# Patient Record
Sex: Male | Born: 1965 | Race: Black or African American | Hispanic: No | Marital: Single | Smoking: Former smoker
Health system: Southern US, Community
[De-identification: ages and names within clinical notes are randomized; demographics above are authoritative.]

## PROBLEM LIST (undated history)

## (undated) DIAGNOSIS — M1990 Unspecified osteoarthritis, unspecified site: Secondary | ICD-10-CM

## (undated) HISTORY — PX: WRIST SURGERY: SHX841

---

## 2004-07-21 ENCOUNTER — Emergency Department (HOSPITAL_COMMUNITY): Admission: EM | Admit: 2004-07-21 | Discharge: 2004-07-22 | Payer: Self-pay | Admitting: Emergency Medicine

## 2005-03-16 ENCOUNTER — Emergency Department (HOSPITAL_COMMUNITY): Admission: EM | Admit: 2005-03-16 | Discharge: 2005-03-16 | Payer: Self-pay | Admitting: Family Medicine

## 2005-12-30 ENCOUNTER — Emergency Department (HOSPITAL_COMMUNITY): Admission: EM | Admit: 2005-12-30 | Discharge: 2005-12-31 | Payer: Self-pay | Admitting: Emergency Medicine

## 2008-11-05 ENCOUNTER — Emergency Department (HOSPITAL_COMMUNITY): Admission: EM | Admit: 2008-11-05 | Discharge: 2008-11-06 | Payer: Self-pay | Admitting: Emergency Medicine

## 2008-12-19 ENCOUNTER — Emergency Department (HOSPITAL_COMMUNITY): Admission: EM | Admit: 2008-12-19 | Discharge: 2008-12-19 | Payer: Self-pay | Admitting: Emergency Medicine

## 2008-12-25 ENCOUNTER — Emergency Department (HOSPITAL_COMMUNITY): Admission: EM | Admit: 2008-12-25 | Discharge: 2008-12-25 | Payer: Self-pay | Admitting: Emergency Medicine

## 2009-03-14 ENCOUNTER — Ambulatory Visit (HOSPITAL_COMMUNITY): Admission: RE | Admit: 2009-03-14 | Discharge: 2009-03-14 | Payer: Self-pay | Admitting: Chiropractic Medicine

## 2009-10-17 ENCOUNTER — Encounter: Admission: RE | Admit: 2009-10-17 | Discharge: 2009-12-17 | Payer: Self-pay | Admitting: Internal Medicine
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
## 2010-08-21 ENCOUNTER — Ambulatory Visit (HOSPITAL_BASED_OUTPATIENT_CLINIC_OR_DEPARTMENT_OTHER): Admission: RE | Admit: 2010-08-21 | Discharge: 2010-08-21 | Payer: Self-pay | Admitting: Orthopedic Surgery

## 2010-11-01 ENCOUNTER — Encounter: Payer: Self-pay | Admitting: *Deleted

## 2010-12-23 LAB — POCT HEMOGLOBIN-HEMACUE: Hemoglobin: 14.1 g/dL (ref 13.0–17.0)

## 2011-01-22 LAB — POCT I-STAT, CHEM 8
BUN: 19 mg/dL (ref 6–23)
Calcium, Ion: 1.07 mmol/L — ABNORMAL LOW (ref 1.12–1.32)
Chloride: 107 mEq/L (ref 96–112)
Creatinine, Ser: 1.4 mg/dL (ref 0.4–1.5)
Glucose, Bld: 126 mg/dL — ABNORMAL HIGH (ref 70–99)
HCT: 43 % (ref 39.0–52.0)
Hemoglobin: 14.6 g/dL (ref 13.0–17.0)
Potassium: 4.8 mEq/L (ref 3.5–5.1)
Sodium: 139 mEq/L (ref 135–145)
TCO2: 26 mmol/L (ref 0–100)

## 2011-01-26 LAB — CBC
HCT: 43.6 % (ref 39.0–52.0)
Hemoglobin: 14.6 g/dL (ref 13.0–17.0)
MCHC: 33.5 g/dL (ref 30.0–36.0)
MCV: 93.5 fL (ref 78.0–100.0)
Platelets: 219 10*3/uL (ref 150–400)
RBC: 4.66 MIL/uL (ref 4.22–5.81)
RDW: 13.4 % (ref 11.5–15.5)
WBC: 13.2 10*3/uL — ABNORMAL HIGH (ref 4.0–10.5)

## 2011-01-26 LAB — COMPREHENSIVE METABOLIC PANEL
ALT: 19 U/L (ref 0–53)
AST: 21 U/L (ref 0–37)
Albumin: 3.5 g/dL (ref 3.5–5.2)
Alkaline Phosphatase: 102 U/L (ref 39–117)
BUN: 15 mg/dL (ref 6–23)
CO2: 27 mEq/L (ref 19–32)
Calcium: 9 mg/dL (ref 8.4–10.5)
Chloride: 106 mEq/L (ref 96–112)
Creatinine, Ser: 1.23 mg/dL (ref 0.4–1.5)
GFR calc Af Amer: >60 mL/min (ref >60)
GFR calc non Af Amer: >60 mL/min (ref >60)
Glucose, Bld: 103 mg/dL — ABNORMAL HIGH (ref 70–99)
Potassium: 4.4 mEq/L (ref 3.5–5.1)
Sodium: 139 mEq/L (ref 135–145)
Total Bilirubin: 1.3 mg/dL — ABNORMAL HIGH (ref 0.3–1.2)
Total Protein: 6.8 g/dL (ref 6.0–8.3)

## 2011-01-26 LAB — DIFFERENTIAL
Basophils Absolute: 0 10*3/uL (ref 0.0–0.1)
Basophils Relative: 0 % (ref 0–1)
Eosinophils Absolute: 0 10*3/uL (ref 0.0–0.7)
Eosinophils Relative: 0 % (ref 0–5)
Lymphocytes Relative: 20 % (ref 12–46)
Lymphs Abs: 2.6 10*3/uL (ref 0.7–4.0)
Monocytes Absolute: 0.5 10*3/uL (ref 0.1–1.0)
Monocytes Relative: 4 % (ref 3–12)
Neutro Abs: 10 10*3/uL — ABNORMAL HIGH (ref 1.7–7.7)
Neutrophils Relative %: 76 % (ref 43–77)

## 2011-02-24 NOTE — Op Note (Signed)
Brian Cain, Brian Cain              ACCOUNT NO.:  [id number]   MEDICAL RECORD NO.:  [id number]          PATIENT TYPE:  EMS   LOCATION:  [facility]                         FACILITY:  [facility]   PHYSICIAN:  Wilmon Arms. Corliss Skains, M.D. DATE OF BIRTH:  [birth year]   DATE OF PROCEDURE:  11/05/2008  DATE OF DISCHARGE:                               OPERATIVE REPORT   PREOPERATIVE DIAGNOSIS:  Left perirectal abscess.   POSTOPERATIVE DIAGNOSIS:  Left perirectal abscess.   PROCEDURE:  Incision and drainage, left perirectal abscess.   SURGEON:  Wilmon Arms. Tsuei, M.D.   ANESTHESIA:  Local.   INDICATIONS:  The patient is a 45 year old male with a history of  multiple previous perirectal abscesses who presents with a 5-day history  of increasing pain and swelling in the left perirectal region.  He  presented to the emergency department today where he was evaluated by  the emergency department physician.  His white count was elevated at  13.2.  On examination, he was noted to have a little bit of fluctuance  on the left side.  CT scan confirmed a 2-cm perirectal abscess.  We were  then asked to see the patient for I and D.   DESCRIPTION OF PROCEDURE:  The patient was in the left lateral position  on the emergency department bed.  The area over the fluctuance was  prepped with Betadine and was anesthetized with 1% lidocaine.  I made a  transverse incision measuring about 1.5 cm directly over this area.  We  encountered minimal purulent fluid coming out of this area.  I opened  one widely with a hemostat and explored the entire cavity.  The wound  was then packed with 1/4-inch new gauze.  A dry dressing was applied.  The patient is being discharged on Augmentin as well as p.r.n. pain  medication.  He may follow up with [facility] at [phone].      Wilmon Arms. Tsuei, M.D.  Electronically Signed     MKT/MEDQ  D:  11/05/2008  T:  11/06/2008  Job:  [id number]

## 2011-06-09 ENCOUNTER — Emergency Department (HOSPITAL_COMMUNITY)
Admission: EM | Admit: 2011-06-09 | Discharge: 2011-06-10 | Disposition: A | Discharge status: Home / Self Care | Payer: Medicaid Other | Attending: Emergency Medicine | Admitting: Emergency Medicine

## 2011-06-09 ENCOUNTER — Emergency Department (HOSPITAL_COMMUNITY): Payer: Medicaid Other

## 2011-06-09 DIAGNOSIS — R071 Chest pain on breathing: Secondary | ICD-10-CM | POA: Insufficient documentation

## 2011-06-09 LAB — COMPREHENSIVE METABOLIC PANEL
ALT: 22 U/L (ref 0–53)
AST: 16 U/L (ref 0–37)
Albumin: 4 g/dL (ref 3.5–5.2)
Alkaline Phosphatase: 111 U/L (ref 39–117)
BUN: 14 mg/dL (ref 6–23)
CO2: 29 mEq/L (ref 19–32)
Calcium: 10 mg/dL (ref 8.4–10.5)
Chloride: 101 mEq/L (ref 96–112)
Creatinine, Ser: 1.33 mg/dL (ref 0.50–1.35)
GFR calc Af Amer: >60 mL/min (ref >60)
GFR calc non Af Amer: 59 mL/min — ABNORMAL LOW (ref >60)
Glucose, Bld: 145 mg/dL — ABNORMAL HIGH (ref 70–99)
Potassium: 3.9 mEq/L (ref 3.5–5.1)
Sodium: 138 mEq/L (ref 135–145)
Total Bilirubin: 0.3 mg/dL (ref 0.3–1.2)
Total Protein: 7.3 g/dL (ref 6.0–8.3)

## 2011-06-09 LAB — CBC
HCT: 39.4 % (ref 39.0–52.0)
Hemoglobin: 14 g/dL (ref 13.0–17.0)
MCH: 32.3 pg (ref 26.0–34.0)
MCHC: 35.5 g/dL (ref 30.0–36.0)
MCV: 90.8 fL (ref 78.0–100.0)
Platelets: 224 10*3/uL (ref 150–400)
RBC: 4.34 MIL/uL (ref 4.22–5.81)
RDW: 12.9 % (ref 11.5–15.5)
WBC: 9.6 10*3/uL (ref 4.0–10.5)

## 2011-06-09 LAB — POCT I-STAT TROPONIN I: Troponin i, poc: 0.01 ng/mL (ref 0.00–0.08)

## 2011-06-09 LAB — CK TOTAL AND CKMB (NOT AT ARMC)
CK, MB: 3 ng/mL (ref 0.3–4.0)
Relative Index: 1.8 (ref 0.0–2.5)
Total CK: 163 U/L (ref 7–232)

## 2011-06-09 LAB — LIPASE, BLOOD: Lipase: 92 U/L — ABNORMAL HIGH (ref 11–59)

## 2011-06-09 LAB — TROPONIN I: Troponin I: <0.3 ng/mL (ref <0.30)

## 2011-06-10 LAB — POCT I-STAT TROPONIN I: Troponin i, poc: 0 ng/mL (ref 0.00–0.08)

## 2011-08-21 ENCOUNTER — Encounter: Payer: Self-pay | Admitting: *Deleted

## 2011-08-21 ENCOUNTER — Ambulatory Visit (HOSPITAL_COMMUNITY)
Admission: EM | Admit: 2011-08-21 | Discharge: 2011-08-22 | DRG: 506 | Disposition: A | Discharge status: Home / Self Care | Payer: Medicaid Other | Attending: Orthopedic Surgery | Admitting: Orthopedic Surgery

## 2011-08-21 ENCOUNTER — Emergency Department (HOSPITAL_COMMUNITY): Payer: Medicaid Other

## 2011-08-21 DIAGNOSIS — S61459A Open bite of unspecified hand, initial encounter: Secondary | ICD-10-CM

## 2011-08-21 DIAGNOSIS — F17200 Nicotine dependence, unspecified, uncomplicated: Secondary | ICD-10-CM | POA: Diagnosis present

## 2011-08-21 DIAGNOSIS — S61409A Unspecified open wound of unspecified hand, initial encounter: Secondary | ICD-10-CM | POA: Diagnosis present

## 2011-08-21 DIAGNOSIS — M009 Pyogenic arthritis, unspecified: Secondary | ICD-10-CM | POA: Diagnosis present

## 2011-08-21 DIAGNOSIS — L03113 Cellulitis of right upper limb: Secondary | ICD-10-CM

## 2011-08-21 DIAGNOSIS — W540XXA Bitten by dog, initial encounter: Secondary | ICD-10-CM | POA: Diagnosis present

## 2011-08-21 LAB — CBC
MCH: 32.5 pg (ref 26.0–34.0)
MCHC: 35.1 g/dL (ref 30.0–36.0)
MCV: 92.5 fL (ref 78.0–100.0)
Platelets: 233 10*3/uL (ref 150–400)
RDW: 13.3 % (ref 11.5–15.5)

## 2011-08-21 MED ORDER — RABIES IMMUNE GLOBULIN 150 UNIT/ML IM INJ
20.0000 [IU]/kg | INJECTION | Freq: Once | INTRAMUSCULAR | Status: AC
  Administered 2011-08-21: 2250 [IU] via INTRAMUSCULAR
  Filled 2011-08-21: qty 15

## 2011-08-21 MED ORDER — PIPERACILLIN-TAZOBACTAM 3.375 G IVPB: 3.3750 g | Freq: Once | INTRAVENOUS | Status: DC

## 2011-08-21 MED ORDER — RABIES VACCINE, PCEC IM SUSR
1.0000 mL | Freq: Once | INTRAMUSCULAR | Status: AC
  Administered 2011-08-21: 1 mL via INTRAMUSCULAR
  Filled 2011-08-21 (×2): qty 1

## 2011-08-21 MED ORDER — LACTATED RINGERS IV SOLN
INTRAVENOUS | Status: DC
  Administered 2011-08-21: via INTRAVENOUS

## 2011-08-21 MED ORDER — SODIUM CHLORIDE 0.9 % IV SOLN
1.5000 g | Freq: Once | INTRAVENOUS | Status: AC
  Administered 2011-08-22: 1.5 g via INTRAVENOUS
  Filled 2011-08-21: qty 1.5

## 2011-08-21 MED ORDER — MORPHINE SULFATE 4 MG/ML IJ SOLN
1.0000 mg | INTRAMUSCULAR | Status: DC | PRN
  Administered 2011-08-21 – 2011-08-22 (×2): 1 mg via INTRAVENOUS
  Filled 2011-08-21: qty 1

## 2011-08-21 MED ORDER — ONDANSETRON HCL 4 MG/2ML IJ SOLN
4.0000 mg | Freq: Four times a day (QID) | INTRAMUSCULAR | Status: DC | PRN
  Administered 2011-08-21: 4 mg via INTRAVENOUS
  Filled 2011-08-21: qty 2

## 2011-08-21 MED ORDER — SODIUM CHLORIDE 0.9 % IV SOLN: Freq: Once | INTRAVENOUS | Status: DC

## 2011-08-21 NOTE — ED Notes (Signed)
Pt reports he was bite by a dog yesterday. Unknown if dog has shots. Noted swelling to right hand. With warmth. Unable to feel pulse because of swelling. Cap refill less than 2. Reports he feels like he has had a fever.

## 2011-08-21 NOTE — ED Notes (Signed)
Pt was walking his dog yesterday when he was bitten in hand by another dog.  Pt does not know where dog came from or where he is at this time.  Last tetanus was 3 years prior.  R hand red with increasing pain.

## 2011-08-21 NOTE — Progress Notes (Signed)
MEDICATION RELATED CONSULT NOTE - INITIAL   Pharmacy Consult for Rabies immune globulin  Indication: Dog Bite  No Known Allergies  Patient Measurements: Height: 6' (182.9 cm) Weight: 245 lb (111.131 kg) IBW/kg (Calculated) : 77.6   Vital Signs: Temp: 98.4 F (36.9 C) (11/09 2146) Temp src: Oral (11/09 2146) BP: 163/91 mmHg (11/09 2146) Pulse Rate: 73  (11/09 2146) Intake/Output from previous day:   Intake/Output from this shift:    Labs:  [facility] 08/21/11 1912  WBC 12.1*  HGB 14.8  HCT 42.2  PLT 233  APTT --  CREATININE --  LABCREA --  CREATININE --  CREAT24HRUR --  MG --  PHOS --  ALBUMIN --  PROT --  ALBUMIN --  AST --  ALT --  ALKPHOS --  BILITOT --  BILIDIR --  IBILI --   Estimated Creatinine Clearance: 92.2 ml/min (by C-G formula based on Cr of 1.33).   Microbiology: No results found for this or any previous visit (from the past 720 hour(s)).  Medical History: History reviewed. No pertinent past medical history.  Medications:   (Not in a hospital admission) Scheduled:    . rabies immune globulin  20 Units/kg Intramuscular Once  . rabies vaccine, PCEC  1 mL Intramuscular Once    Assessment: 45 y.o. M to receive rabies immune globulin after being bit by a dog yesterday. The patient weighs ~111 kg. The patient is also receiving a rabies vaccine tonight.  Plan:  1. Rabies immune globulin 2250 units x 1 dose 2. Would recommend that the patient also receive a total of 4 doses of rabies vaccine (day 0, 3, 7, and 14) for postexposure prophylaxis. 3. Educated nursing staff on proper administration of both rabies immune globulin and vaccine  Rolley Sims 08/21/2011,10:29 PM

## 2011-08-21 NOTE — H&P (Signed)
Brian Cain is an 45 y.o. male.   Chief Complaint: Dog bite HPI: 62 yo RHD male states he was bitten by unknown dog yesterday.  Bite to right hand.  Began to have swelling and pain in hand today.  No F/C/NS.  No previous injury to right hand.  No other bites at this time.  History reviewed. No pertinent past medical history.  No past surgical history on file.  Family History  Problem Relation Age of Onset  . Cancer Mother    Social History:  reports that he has been smoking Cigarettes.  He has smoked for the past 1 year. He has never used smokeless tobacco. He reports that he drinks alcohol. He reports that he does not use illicit drugs.  Allergies: No Known Allergies  Medications Prior to Admission  Medication Dose Route Frequency Provider Last Rate Last Dose  . 0.9 %  sodium chloride infusion   Intravenous Once Tatyana A Kirichenko, PA      . piperacillin-tazobactam (ZOSYN) IVPB 3.375 g  3.375 g Intravenous Once Tatyana A Kirichenko, PA      . rabies immune globulin (HYPERAB) injection 2,250 Units  20 Units/kg Intramuscular Once Tatyana A Kirichenko, PA      . rabies vaccine, PCEC (RABAVERT) injection 1 mL  1 mL Intramuscular Once Tatyana A Kirichenko, PA       No current outpatient prescriptions on file as of 08/21/2011.    Results for orders placed during the hospital encounter of 08/21/11 (from the past 48 hour(s))  CBC     Status: Abnormal   Collection Time   08/21/11  7:12 PM      Component Value Range Comment   WBC 12.1 (*) 4.0 - 10.5 (K/uL)    RBC 4.56  4.22 - 5.81 (MIL/uL)    Hemoglobin 14.8  13.0 - 17.0 (g/dL)    HCT 40.9  81.1 - 91.4 (%)    MCV 92.5  78.0 - 100.0 (fL)    MCH 32.5  26.0 - 34.0 (pg)    MCHC 35.1  30.0 - 36.0 (g/dL)    RDW 78.2  95.6 - 21.3 (%)    Platelets 233  150 - 400 (K/uL)     Dg Hand Complete Right  08/21/2011  *RADIOLOGY REPORT*  Clinical Data: Bitten by polyp, redness/swelling to right hand, body marks at the level of second/third MCP  joints  RIGHT HAND - COMPLETE 3+ VIEW  Comparison: None.  Findings: No fracture or dislocation is seen.  The joint spaces are preserved.  Moderate soft tissue swelling involving the dorsum of the hand.  No radiopaque foreign body is seen.  IMPRESSION: No fracture, dislocation, or radiopaque foreign body is seen.  Moderate soft tissue swelling involving the dorsum of the hand.  Original Report Authenticated By: Charline Bills, M.D.     A comprehensive review of systems was negative.  Blood pressure 163/91, pulse 73, temperature 98.4 F (36.9 C), temperature source Oral, resp. rate 18, height 6' (1.829 m), weight 111.131 kg (245 lb), SpO2 97.00%.  General appearance: alert, cooperative and appears stated age Head: Normocephalic, without obvious abnormality, atraumatic Neck: supple, symmetrical, trachea midline Resp: clear to auscultation bilaterally Cardio: regular rate and rhythm, S1, S2 normal, no murmur, click, rub or gallop GI: soft, non-tender; bowel sounds normal; no masses,  no organomegaly Extremities: Bilateral upper extremitis intact to light touch sensation and capillary refill in all digits.  +epl/fpl/io.  left upper extremity without wound or tenderness to palpation.  Right upper extremity: bite wounds dorsum of hand at long finger MP joint.  Swelling, warmth, erythema dorsal hand.  Tender to palpation dorsally.  No volar tenderness except minimally at long MP joint.  can flex and extend all digits.  Bite wound on volar ring finger and dorsal small finger without erythema or swelling surrounding. Pulses: 2+ and symmetric Skin: Bite wounds as described above. Neurologic: Grossly normal Incision/Wound: Wounds as above.  Assessment/Plan Recommend OR for irrigation and debridement of dog bite wounds right hand.  May need I&D of long finger MP joint.  Risks, benefits, alternatives discussed.  Patient agrees with plan.    Valeria Krisko R 08/21/2011, 11:03 PM

## 2011-08-21 NOTE — ED Provider Notes (Signed)
History     CSN: [id number] Arrival date & time: 08/21/2011  7:03 PM   First MD Initiated Contact with Patient 08/21/11 2128      Chief Complaint  Patient presents with  . Animal Bite    (Consider location/radiation/quality/duration/timing/severity/associated sxs/prior treatment) Patient is a 45 y.o. male presenting with animal bite. The history is provided by the patient.  Animal Bite  The incident occurred yesterday. There is an injury to the right hand. There is an injury to the right ring finger and right little finger. The pain is moderate. It is unknown if a foreign body is present. Associated symptoms include pain when bearing weight. Pertinent negatives include no numbness, no abdominal pain, no nausea, no vomiting and no weakness. There have been no prior injuries to these areas. He is right-handed. His tetanus status is UTD.  Pt bit on the right hand by a rottweiler  Yesterday. Unsure of dog's rabies status or who the dog belongs to. States was feeling OK until this afternoon around 3 pm when hand began to swell and turned red and warm. Denies fever, chills, malaise.   History reviewed. No pertinent past medical history.  No past surgical history on file.  Family History  Problem Relation Age of Onset  . Cancer Mother     History  Substance Use Topics  . Smoking status: Current Everyday Smoker -- 1 years    Types: Cigarettes  . Smokeless tobacco: Never Used  . Alcohol Use: Yes      Review of Systems  Constitutional: Negative for fever, chills and fatigue.  HENT: Negative.   Respiratory: Negative.   Cardiovascular: Negative.   Gastrointestinal: Negative.  Negative for nausea, vomiting and abdominal pain.  Musculoskeletal: Positive for joint swelling.  Neurological: Negative for dizziness, weakness and numbness.  Hematological: Negative.     Allergies  Review of patient's allergies indicates no known allergies.  Home Medications   Current Outpatient Rx    Name Route Sig Dispense Refill  . PRESCRIPTION MEDICATION Oral Take 1 tablet by mouth 2 (two) times daily as needed. For relief from boil.  Prescription antibiotic given for boils.       BP 163/91  Pulse 73  Temp(Src) 98.4 F (36.9 C) (Oral)  Resp 18  SpO2 97%  Physical Exam  Constitutional: He is oriented to person, place, and time. He appears well-developed and well-nourished.  HENT:  Head: Normocephalic and atraumatic.  Neck: Neck supple.  Cardiovascular: Normal rate, regular rhythm and normal heart sounds.   Pulmonary/Chest: Effort normal and breath sounds normal.  Musculoskeletal:       Right hand swelling from wrist down to all fingers, erythema to the dorsum of the right hand. Two puncutre marks to the dorsal right hand and one to the ring finger, one to 5th finger. Pain with ROM of all finger, pt unable to flex or extend any fingers.   Neurological: He is alert and oriented to person, place, and time. He has normal reflexes.  Skin: Skin is warm and dry. There is erythema.  Psychiatric: He has a normal mood and affect.    ED Course  Procedures (including critical care time)  Labs Reviewed  CBC - Abnormal; Notable for the following:    WBC 12.1 (*)    All other components within normal limits   Dg Hand Complete Right  08/21/2011  *RADIOLOGY REPORT*  Clinical Data: Bitten by polyp, redness/swelling to right hand, body marks at the level of second/third MCP joints  RIGHT HAND - COMPLETE 3+ VIEW  Comparison: None.  Findings: No fracture or dislocation is seen.  The joint spaces are preserved.  Moderate soft tissue swelling involving the dorsum of the hand.  No radiopaque foreign body is seen.  IMPRESSION: No fracture, dislocation, or radiopaque foreign body is seen.  Moderate soft tissue swelling involving the dorsum of the hand.  Original Report Authenticated By: Charline Bills, M.D.     10:03 PM Negative right hand x-ray. Pt has significant swelling and pain with  movement of the right hand. Will get hand consulted. Rabies ordered since unknown dog or rabies status.    10:17 PM Pt seen by Dr. Merlyn Lot, will go up to OR  MDM          Lottie Mussel, PA 08/21/11 2217

## 2011-08-22 ENCOUNTER — Inpatient Hospital Stay (HOSPITAL_COMMUNITY): Payer: Medicaid Other | Admitting: Anesthesiology

## 2011-08-22 ENCOUNTER — Encounter (HOSPITAL_COMMUNITY)
Admission: EM | Disposition: A | Discharge status: Home / Self Care | Payer: Self-pay | Source: Home / Self Care | Attending: Emergency Medicine

## 2011-08-22 ENCOUNTER — Encounter (HOSPITAL_COMMUNITY): Payer: Self-pay | Admitting: Anesthesiology

## 2011-08-22 HISTORY — PX: I&D EXTREMITY: SHX5045

## 2011-08-22 SURGERY — IRRIGATION AND DEBRIDEMENT EXTREMITY
Anesthesia: General | Laterality: Right

## 2011-08-22 MED ORDER — PROMETHAZINE HCL 25 MG/ML IJ SOLN: 6.2500 mg | INTRAMUSCULAR | Status: DC | PRN

## 2011-08-22 MED ORDER — SUCCINYLCHOLINE CHLORIDE 20 MG/ML IJ SOLN
INTRAMUSCULAR | Status: DC | PRN
  Administered 2011-08-22: 100 mg via INTRAVENOUS

## 2011-08-22 MED ORDER — HYDROMORPHONE HCL PF 1 MG/ML IJ SOLN
0.2500 mg | INTRAMUSCULAR | Status: DC | PRN
  Administered 2011-08-22 (×2): 0.05 mg via INTRAVENOUS

## 2011-08-22 MED ORDER — KETOROLAC TROMETHAMINE 30 MG/ML IJ SOLN
INTRAMUSCULAR | Status: DC | PRN
  Administered 2011-08-22: 30 mg via INTRAVENOUS

## 2011-08-22 MED ORDER — MEPERIDINE HCL 25 MG/ML IJ SOLN: 6.2500 mg | INTRAMUSCULAR | Status: DC | PRN

## 2011-08-22 MED ORDER — SODIUM CHLORIDE 0.9 % IV SOLN
1.5000 g | Freq: Four times a day (QID) | INTRAVENOUS | Status: DC
  Filled 2011-08-22: qty 1.5

## 2011-08-22 MED ORDER — LIDOCAINE HCL (CARDIAC) 20 MG/ML IV SOLN
INTRAVENOUS | Status: DC | PRN
  Administered 2011-08-22: 100 mg via INTRAVENOUS

## 2011-08-22 MED ORDER — SODIUM CHLORIDE 0.9 % IV SOLN: 1.5000 g | INTRAVENOUS | Status: DC

## 2011-08-22 MED ORDER — BUPIVACAINE HCL (PF) 0.25 % IJ SOLN
INTRAMUSCULAR | Status: DC | PRN
  Administered 2011-08-22: 4.5 mL

## 2011-08-22 MED ORDER — MIDAZOLAM HCL 2 MG/2ML IJ SOLN: 0.5000 mg | Freq: Once | INTRAMUSCULAR | Status: DC | PRN

## 2011-08-22 MED ORDER — PROPOFOL 10 MG/ML IV EMUL
INTRAVENOUS | Status: DC | PRN
  Administered 2011-08-22: 200 mg via INTRAVENOUS

## 2011-08-22 MED ORDER — SODIUM CHLORIDE 0.9 % IR SOLN
Status: DC | PRN
  Administered 2011-08-22: 500 mL

## 2011-08-22 MED ORDER — FENTANYL CITRATE 0.05 MG/ML IJ SOLN
INTRAMUSCULAR | Status: DC | PRN
  Administered 2011-08-22: 150 ug via INTRAVENOUS
  Administered 2011-08-22: 100 ug via INTRAVENOUS

## 2011-08-22 MED ORDER — SODIUM CHLORIDE 0.9 % IV SOLN
1.5000 g | INTRAVENOUS | Status: DC | PRN
  Administered 2011-08-22: 1.5 g via INTRAVENOUS

## 2011-08-22 MED ORDER — LACTATED RINGERS IV SOLN
INTRAVENOUS | Status: DC | PRN
  Administered 2011-08-22 (×2): via INTRAVENOUS

## 2011-08-22 SURGICAL SUPPLY — 63 items
BANDAGE ACE 4 STERILE (GAUZE/BANDAGES/DRESSINGS) ×2 IMPLANT
BANDAGE COBAN STERILE 2 (GAUZE/BANDAGES/DRESSINGS) IMPLANT
BANDAGE CONFORM 2  STR LF (GAUZE/BANDAGES/DRESSINGS) IMPLANT
BANDAGE ELASTIC 3 VELCRO ST LF (GAUZE/BANDAGES/DRESSINGS) IMPLANT
BANDAGE ELASTIC 4 VELCRO ST LF (GAUZE/BANDAGES/DRESSINGS) ×2 IMPLANT
BANDAGE GAUZE ELAST BULKY 4 IN (GAUZE/BANDAGES/DRESSINGS) ×2 IMPLANT
BNDG COHESIVE 1X5 TAN STRL LF (GAUZE/BANDAGES/DRESSINGS) IMPLANT
BNDG ESMARK 4X9 LF (GAUZE/BANDAGES/DRESSINGS) ×2 IMPLANT
CLOTH BEACON ORANGE TIMEOUT ST (SAFETY) ×2 IMPLANT
CORDS BIPOLAR (ELECTRODE) ×2 IMPLANT
COVER SURGICAL LIGHT HANDLE (MISCELLANEOUS) ×2 IMPLANT
CUFF TOURNIQUET SINGLE 18IN (TOURNIQUET CUFF) ×2 IMPLANT
DECANTER SPIKE VIAL GLASS SM (MISCELLANEOUS) ×2 IMPLANT
DRAIN PENROSE 1/4X12 LTX STRL (WOUND CARE) IMPLANT
DRAPE SURG 17X23 STRL (DRAPES) ×2 IMPLANT
DRSG ADAPTIC 3X8 NADH LF (GAUZE/BANDAGES/DRESSINGS) IMPLANT
DRSG EMULSION OIL 3X3 NADH (GAUZE/BANDAGES/DRESSINGS) IMPLANT
DRSG PAD ABDOMINAL 8X10 ST (GAUZE/BANDAGES/DRESSINGS) IMPLANT
GAUZE PACKING IODOFORM 1/4X5 (PACKING) ×2 IMPLANT
GAUZE XEROFORM 1X8 LF (GAUZE/BANDAGES/DRESSINGS) ×2 IMPLANT
GLOVE BIO SURGEON STRL SZ 6.5 (GLOVE) ×2 IMPLANT
GLOVE BIO SURGEON STRL SZ7 (GLOVE) ×2 IMPLANT
GLOVE BIO SURGEON STRL SZ7.5 (GLOVE) ×2 IMPLANT
GLOVE BIOGEL PI IND STRL 6.5 (GLOVE) ×1 IMPLANT
GLOVE BIOGEL PI IND STRL 8 (GLOVE) ×1 IMPLANT
GLOVE BIOGEL PI INDICATOR 6.5 (GLOVE) ×1
GLOVE BIOGEL PI INDICATOR 8 (GLOVE) ×1
GOWN BRE IMP SLV AUR LG STRL (GOWN DISPOSABLE) ×2 IMPLANT
GOWN STRL REIN XL XLG (GOWN DISPOSABLE) ×2 IMPLANT
HANDPIECE INTERPULSE COAX TIP (DISPOSABLE)
IV CYSTO TUBING LATEX FREE (IV SOLUTION) IMPLANT
KIT BASIN OR (CUSTOM PROCEDURE TRAY) ×2 IMPLANT
KIT ROOM TURNOVER OR (KITS) ×2 IMPLANT
LOOP VESSEL MAXI BLUE (MISCELLANEOUS) ×2 IMPLANT
LOOP VESSEL MINI RED (MISCELLANEOUS) IMPLANT
MANIFOLD NEPTUNE II (INSTRUMENTS) ×2 IMPLANT
NEEDLE 22X1 1/2 (OR ONLY) (NEEDLE) ×2 IMPLANT
NEEDLE HYPO 25X1 1.5 SAFETY (NEEDLE) ×2 IMPLANT
NS IRRIG 1000ML POUR BTL (IV SOLUTION) ×2 IMPLANT
PACK ORTHO EXTREMITY (CUSTOM PROCEDURE TRAY) ×2 IMPLANT
PAD ARMBOARD 7.5X6 YLW CONV (MISCELLANEOUS) ×2 IMPLANT
PADDING CAST ABS 4INX4YD NS (CAST SUPPLIES) ×1
PADDING CAST ABS COTTON 4X4 ST (CAST SUPPLIES) ×1 IMPLANT
SCRUB BETADINE 4OZ XXX (MISCELLANEOUS) ×2 IMPLANT
SET HNDPC FAN SPRY TIP SCT (DISPOSABLE) IMPLANT
SOLUTION BETADINE 4OZ (MISCELLANEOUS) ×2 IMPLANT
SPONGE GAUZE 4X4 12PLY (GAUZE/BANDAGES/DRESSINGS) ×2 IMPLANT
SPONGE LAP 18X18 X RAY DECT (DISPOSABLE) IMPLANT
SPONGE LAP 4X18 X RAY DECT (DISPOSABLE) ×2 IMPLANT
SUCTION FRAZIER TIP 10 FR DISP (SUCTIONS) ×2 IMPLANT
SUT ETHILON 4 0 PS 2 18 (SUTURE) IMPLANT
SUT MON AB 5-0 P3 18 (SUTURE) IMPLANT
SWAB CULTURE LIQ STUART DBL (MISCELLANEOUS) ×2 IMPLANT
SYR 20ML ECCENTRIC (SYRINGE) ×2 IMPLANT
SYR CONTROL 10ML LL (SYRINGE) ×2 IMPLANT
TOWEL OR 17X24 6PK STRL BLUE (TOWEL DISPOSABLE) ×2 IMPLANT
TOWEL OR 17X26 10 PK STRL BLUE (TOWEL DISPOSABLE) ×2 IMPLANT
TUBE ANAEROBIC SPECIMEN COL (MISCELLANEOUS) ×2 IMPLANT
TUBE CONNECTING 12X1/4 (SUCTIONS) ×2 IMPLANT
TUBE FEEDING 5FR 15 INCH (TUBING) IMPLANT
UNDERPAD 30X30 INCONTINENT (UNDERPADS AND DIAPERS) ×2 IMPLANT
WATER STERILE IRR 1000ML POUR (IV SOLUTION) IMPLANT
YANKAUER SUCT BULB TIP NO VENT (SUCTIONS) ×2 IMPLANT

## 2011-08-22 NOTE — ED Provider Notes (Signed)
Medical screening examination/treatment/procedure(s) were performed by non-physician practitioner and as supervising physician I was immediately available for consultation/collaboration.  Flint Melter, MD 08/22/11 [phone]

## 2011-08-22 NOTE — Progress Notes (Signed)
IV saline locked with 400cc left in bag

## 2011-08-22 NOTE — Anesthesia Preprocedure Evaluation (Addendum)
Anesthesia Evaluation  Patient identified by MRN, date of birth, ID band Patient awake    Reviewed: Allergy & Precautions, H&P , NPO status   Airway Mallampati: II TM Distance: >3 FB Neck ROM: Full    Dental  (+) Teeth Intact   Pulmonary          Cardiovascular     Neuro/Psych    GI/Hepatic   Endo/Other    Renal/GU      Musculoskeletal   Abdominal   Peds  Hematology   Anesthesia Other Findings   Reproductive/Obstetrics                          Anesthesia Physical Anesthesia Plan  ASA: II  Anesthesia Plan: General   Post-op Pain Management:    Induction: Intravenous and Cricoid pressure planned  Airway Management Planned: Oral ETT  Additional Equipment:   Intra-op Plan:   Post-operative Plan: Extubation in OR  Informed Consent: I have reviewed the patients History and Physical, chart, labs and discussed the procedure including the risks, benefits and alternatives for the proposed anesthesia with the patient or authorized representative who has indicated his/her understanding and acceptance.   Dental advisory given  Plan Discussed with:   Anesthesia Plan Comments:         Anesthesia Quick Evaluation

## 2011-08-22 NOTE — Op Note (Signed)
[name], [name] NO.:  [id number]  MEDICAL RECORD NO.:  [id number]  LOCATION:  [facility]                         FACILITY:  [facility]  PHYSICIAN:  Betha Loa, MD        DATE OF BIRTH:  08/14/1966  DATE OF PROCEDURE:  08/22/2011 DATE OF DISCHARGE:                              OPERATIVE REPORT   PREOPERATIVE DIAGNOSIS:  Right hand infected dog bite.  POSTOP DIAGNOSIS:  Right hand infected dog bite.  PROCEDURE:  Irrigation and debridement of dog bite wounds right hand and right long finger MP joint.  SURGEON:  Betha Loa, MD  ASSISTANTS:  None.  ANESTHESIA:  General.  IV FLUIDS:  Per anesthesia flow sheet.  ESTIMATED BLOOD LOSS:  Minimal.  COMPLICATIONS:  None.  SPECIMENS:  Cultures to Micro.  TOURNIQUET TIME:  33 minutes.  DISPOSITION:  Stable to PACU.  INDICATIONS:  Brian Cain is a 45 year old male who 2 days ago was bitten by an unknown dog.  He yesterday began to note some swelling, pain, and erythema in the hand.  He presented to [facility] Emergency Department where I was consulted for evaluation.  On evaluation, he had intact sensation and capillary refill in the fingertips.  He had swollen hand.  There was erythema on the dorsum of the hand.  There was a bite wound at the dorsal aspect of the MP joint of the long finger, one on the ulnar side of the ring finger and one on the dorsum of the small finger.  I discussed with Mr. Marcella the nature of dog bites.  I recommended going to the operating room for irrigation and debridement of the bite wounds and likely the MP joint of the long finger.  Risks, benefits, alternatives of surgery were discussed including risk of blood loss, infection, damage to nerves, vessels, tendons, ligaments, bone, failure of surgery, need for additional surgery, complications of wound healing, continued pain, nonunion, malunion, stiffness, need for repeat irrigation and debridement.  He voiced understanding of these  risks and elected to proceed.  OPERATIVE COURSE:  After being identified preoperatively by myself, the patient and I agreed upon procedure and site of procedure.  Surgical site was marked.  The risks, benefits, and alternative surgery were reviewed and he wished to proceed.  Surgical consent has been signed. His IV Unasyn was redosed.  He was transferred to the operating room, placed on the operating table in supine position with the right upper extremity on arm board.  General anesthesia was induced by the anesthesiologist.  Right upper extremity was prepped and draped in normal sterile orthopedic fashion.  Surgical pause was performed between surgeons, anesthesia, and operating staff, and all were in agreement with the patient, procedure, and site of procedure.  An Esmarch bandage was used on the forearm and after gravity exsanguination for the hand. Tourniquet was inflated to 250 mmHg.  The 2 wounds on the dorsum of the hand at the MP joint and long finger were connected.  There was edema within the tissues.  There was 1 piece of hard material that was sent with cultures.  Aerobes and anaerobes cultures were taken.  The MP joint was entered.  There was a marked damage to the articular cartilage on the dorsal aspect of the metacarpal head.  There was no gross purulence. 150 mL of sterile saline were rinsed through the MP joint using angiocatheter needle.  The wounds on the ring finger and small finger were opened.  The wound on the small finger was extended slightly distally.  There was no gross purulence, but there was some cloudy fluid in the small finger.  All wounds were copiously irrigated with 1000 mL of sterile saline.  All wounds were then packed with quarter-inch iodoform gauze.  Vessel loop drains were placed in the MP joint.  The wounds were then injected with 4 mL of 0.25% plain Marcaine to aid in postoperative analgesia.  The wounds were dressed with sterile Xeroform, 4 x  4's, and wrapped with a Kerlix.  A volar splint was placed with the MPs flexed and IPs extended.  Long, ring, and small fingers were included.  This was wrapped with Kerlix and Ace bandage.  Tourniquet was deflated at 33 minutes.  Operative drapes were broken down.  The patient was awoken from anesthesia safely.  He was transferred back to the stretcher and taken to PACU stable condition.  Fingertips were pink with brisk capillary refill after deflation of tourniquet.  He will be discharged home today.  We will continue him on Augmentin 875 mg p.o. b.i.d.  I will see him back in the office on Monday.     Betha Loa, MD     KK/MEDQ  D:  08/22/2011  T:  08/22/2011  Job:  [id number]

## 2011-08-22 NOTE — Brief Op Note (Signed)
08/21/2011 - 08/22/2011  6:38 AM  PATIENT:  Brian Cain  45 y.o. male  PRE-OPERATIVE DIAGNOSIS:  Infected Right Hand  POST-OPERATIVE DIAGNOSIS:  Infected Right Hand  PROCEDURE:  Procedure(s): IRRIGATION AND DEBRIDEMENT EXTREMITY  SURGEON:  Surgeon(s): Tami Ribas  PHYSICIAN ASSISTANT:   ASSISTANTS: none   ANESTHESIA:   general  EBL:  Total I/O In: 400 [I.V.:400] Out: -   BLOOD ADMINISTERED:none  DRAINS: Penrose drain in the Vessel loop drain in long finger mp joint   LOCAL MEDICATIONS USED:  MARCAINE 4CC  SPECIMEN:  Source of Specimen:  dorsum right hand  DISPOSITION OF SPECIMEN:  micro  COUNTS:  YES  TOURNIQUET:   Total Tourniquet Time Documented: Upper Arm (Right) - 33 minutes  DICTATION: .Other Dictation: Dictation Number [id number]  PLAN OF CARE: Discharge to home after PACU  PATIENT DISPOSITION:  PACU - hemodynamically stable.   Delay start of Pharmacological VTE agent (>24hrs) due to surgical blood loss or risk of bleeding:  no

## 2011-08-22 NOTE — ED Provider Notes (Signed)
Medical screening examination/treatment/procedure(s) were conducted as a shared visit with non-physician practitioner(s) and myself.  I personally evaluated the patient during the encounter  Patient has pain and swelling right hand post dog bite. The pain is excruciating on wrist extension or flexion as well as movement of the fingers. The right dorsal hand is markedly swollen with redness and fluctuance. He has decreased sensation in the fingers of the right hand. Patient has acute and infection and will likely need drainage in the operative theater. Consult hand surgery.  Flint Melter, MD 08/22/11 [phone]

## 2011-08-22 NOTE — ED Notes (Signed)
Hand swollen, warm and red.  No discharge noted.  Painful to move.  Insertion wound to post and and palm.

## 2011-08-22 NOTE — Op Note (Signed)
Dictation [id number]

## 2011-08-22 NOTE — ED Notes (Signed)
Unasyn complete.  Pt sleeping.  Await or call.  Called or earlier and stated md was in surgery and would call when complete.

## 2011-08-22 NOTE — ED Notes (Signed)
I called OR - They are still working on case and will call me when the case is complete.

## 2011-08-22 NOTE — Anesthesia Procedure Notes (Signed)
Procedure Name: Intubation Date/Time: 08/22/2011 5:51 AM Performed by: Alanda Amass Pre-anesthesia Checklist: Patient identified, Emergency Drugs available, Suction available, Patient being monitored and Timeout performed Patient Re-evaluated:Patient Re-evaluated prior to inductionOxygen Delivery Method: Circle System Utilized Preoxygenation: Pre-oxygenation with 100% oxygen Intubation Type: IV induction and Circoid Pressure applied Laryngoscope Size: Mac and 3 Grade View: Grade I Tube type: Oral Tube size: 7.5 mm Number of attempts: 1 Placement Confirmation: ETT inserted through vocal cords under direct vision,  positive ETCO2 and breath sounds checked- equal and bilateral Secured at: 23 cm Tube secured with: Tape Dental Injury: Teeth and Oropharynx as per pre-operative assessment

## 2011-08-22 NOTE — ED Notes (Signed)
OR called and they are ready for pt.

## 2011-08-22 NOTE — Transfer of Care (Signed)
Immediate Anesthesia Transfer of Care Note  Patient: Brian Cain  Procedure(s) Performed:  IRRIGATION AND DEBRIDEMENT EXTREMITY  Patient Location: PACU  Anesthesia Type: General  Level of Consciousness: sedated  Airway & Oxygen Therapy: Patient Spontanous Breathing and Patient connected to nasal cannula oxygen  Post-op Assessment: Report given to PACU RN  Post vital signs: stable  Complications: No apparent anesthesia complications

## 2011-08-22 NOTE — Anesthesia Postprocedure Evaluation (Signed)
  Anesthesia Post-op Note  Patient: Brian Cain  Procedure(s) Performed:  IRRIGATION AND DEBRIDEMENT EXTREMITY  Patient Location: PACU  Anesthesia Type: General  Level of Consciousness: awake  Airway and Oxygen Therapy: Patient Spontanous Breathing  Post-op Pain: mild  Post-op Assessment: Post-op Vital signs reviewed  Post-op Vital Signs: stable  Complications: No apparent anesthesia complications

## 2011-08-22 NOTE — ED Notes (Signed)
Pt aware that he needs to have surgery per Dr Merlyn Lot.  He is trying to figure out how to get his daughters home.  Social work notified.

## 2011-08-22 NOTE — ED Notes (Signed)
Unable to locate pt belongings.  All cabinets in pt room, all lockers searched.  Consulting civil engineer notified.

## 2011-08-22 NOTE — ED Notes (Signed)
Report given to nurse anesthetist in OR.  Belonging bags not with pt.  Will attempt to find which locker they were placed in and notify or.

## 2011-08-22 NOTE — ED Notes (Signed)
Belongings located in security as documented,  "sent to safe"

## 2011-08-23 NOTE — Discharge Summary (Signed)
[facility] [phone]

## 2011-08-23 NOTE — Discharge Summary (Signed)
NAMEALEXI, GEIBEL NO.:  [id number]  MEDICAL RECORD NO.:  [id number]  LOCATION:  [facility]                         FACILITY:  [facility]  PHYSICIAN:  Betha Loa, MD        DATE OF BIRTH:  24-Jul-1966  DATE OF ADMISSION:  08/21/2011 DATE OF DISCHARGE:  08/22/2011                              DISCHARGE SUMMARY   FINAL DIAGNOSIS:  Infected dog bite and long finger metacarpophalangeal joint, right hand.  PROCEDURE:  Irrigation and debridement of right hand dog bite wounds and long finger metacarpophalangeal joint.  HISTORY:  Mr. Feggins is a 45 year old male who suffered a dog bite to his right hand by unknown dog day before presentation.  Please change above were abscess left hand to right hand.  He began have swelling and pain in the hand the day of admission.  He had not had any fevers, chills, night sweats.  He reported no previous injuries.  I evaluated him in the emergency department where he was found to have erythema and swelling on dorsum of the hand and tenderness at the MP joint long finger.  I recommended Mr. Langland going to the operating room for irrigation and debridement of the bite wounds and MP joint of the long finger.  Risks, benefits, and alternatives surgery were discussed including the risk of blood loss, infection, damage to nerves, vessels, tendons, ligaments, bone, failure of procedure, need for additional procedures, complications with wound healing, continued pain, continued infection, need for repeat irrigation and debridement.  He voiced understanding of the procedure and elected to proceed.  HOSPITAL COURSE:  Mr. Tiegs was taken to the operating room in the early morning hours of November 10.  Irrigation and debridement of the right hand dog bite wounds and MP joint of the long finger was performed.  This was well tolerated.  He was discharged home from Post Anesthesia Care Unit.  He was given Percocet 5/325 1-2 p.o. q.6 hours p.r.n.  pain, dispensed 40 and Augmentin 875 mg p.o. b.i.d. x7 days. Follow up in 2 days in the office.     Betha Loa, MD     KK/MEDQ  D:  08/23/2011  T:  08/23/2011  Job:  [id number]

## 2011-08-26 ENCOUNTER — Encounter (HOSPITAL_COMMUNITY): Payer: Self-pay | Admitting: Orthopedic Surgery

## 2011-08-26 LAB — ANAEROBIC CULTURE

## 2011-08-26 LAB — WOUND CULTURE

## 2011-10-04 LAB — AFB CULTURE WITH SMEAR (NOT AT ARMC)

## 2011-10-16 ENCOUNTER — Encounter: Payer: Self-pay | Admitting: Infectious Diseases

## 2011-10-19 ENCOUNTER — Telehealth: Payer: Self-pay | Admitting: *Deleted

## 2011-10-19 ENCOUNTER — Ambulatory Visit: Payer: Medicaid Other | Admitting: Infectious Diseases

## 2011-10-19 NOTE — Telephone Encounter (Signed)
States he forgot today's appt. Transferred to [place] at front desk to reschedule

## 2011-10-26 ENCOUNTER — Telehealth: Payer: Self-pay | Admitting: *Deleted

## 2011-10-26 ENCOUNTER — Encounter: Payer: Self-pay | Admitting: Infectious Diseases

## 2011-10-26 ENCOUNTER — Ambulatory Visit (INDEPENDENT_AMBULATORY_CARE_PROVIDER_SITE_OTHER): Payer: Self-pay | Admitting: Infectious Diseases

## 2011-10-26 VITALS — BP 131/88 | HR 77 | Temp 98.0°F | Ht 71.0 in | Wt 248.0 lb

## 2011-10-26 DIAGNOSIS — M869 Osteomyelitis, unspecified: Secondary | ICD-10-CM

## 2011-10-26 LAB — CBC WITH DIFFERENTIAL/PLATELET
Basophils Absolute: 0 10*3/uL (ref 0.0–0.1)
Basophils Relative: 0 % (ref 0–1)
Eosinophils Relative: 2 % (ref 0–5)
Lymphocytes Relative: 41 % (ref 12–46)
MCHC: 33.7 g/dL (ref 30.0–36.0)
MCV: 93.8 fL (ref 78.0–100.0)
Neutro Abs: 3.6 10*3/uL (ref 1.7–7.7)
Platelets: 217 10*3/uL (ref 150–400)
RDW: 13.9 % (ref 11.5–15.5)
WBC: 7 10*3/uL (ref 4.0–10.5)

## 2011-10-26 LAB — BASIC METABOLIC PANEL
BUN: 16 mg/dL (ref 6–23)
CO2: 23 mEq/L (ref 19–32)
Calcium: 8.9 mg/dL (ref 8.4–10.5)
Glucose, Bld: 102 mg/dL — ABNORMAL HIGH (ref 70–99)

## 2011-10-26 MED ORDER — SODIUM CHLORIDE 0.9 % IV SOLN: 1.0000 g | INTRAVENOUS | Status: DC

## 2011-10-26 MED ORDER — DOXYCYCLINE HYCLATE 100 MG PO TABS: 100.0000 mg | ORAL_TABLET | Freq: Two times a day (BID) | ORAL | Status: AC

## 2011-10-26 NOTE — Progress Notes (Signed)
  Subjective:    Patient ID: Brian Cain, male    DOB: 02/11/1966, 46 y.o.   MRN: [id number]  HPI 46 yo M with hx of dog bite to 3rd digit and R hand on 08-20-11. He underwent I & D on 08-22-11, Cx sent (polymicrobial, no staph, strep. AFB Cx -). He was treated with Augmentin. He was seen in f/u on 09-29-11 and was started on a 2 week course of bactrim. He underwent a MRI 09-30-11 to f/u and was found to have concern for osteomyelitis with suspected small 3rd MTP joint effusion (? Septic joint).  Since has had decreasing movement, has some pain still. Has limitation of movement of finger as well.    Review of Systems  Constitutional: Negative for fever, chills, appetite change and unexpected weight change.  Gastrointestinal: Negative for diarrhea and constipation.       Has dumping after he eats, 30 min later  Genitourinary: Negative for dysuria.  Musculoskeletal: Positive for arthralgias.  Hematological: Negative for adenopathy.       Objective:   Physical Exam  Constitutional: He appears well-developed and well-nourished.  HENT:  Head: Normocephalic.  Mouth/Throat: No oropharyngeal exudate.  Eyes: EOM are normal. Pupils are equal, round, and reactive to light.  Neck: Neck supple.  Cardiovascular: Normal rate, regular rhythm and normal heart sounds.   Pulmonary/Chest: Effort normal and breath sounds normal.  Abdominal: Soft. Bowel sounds are normal.  Musculoskeletal:       Hands: Lymphadenopathy:    He has no cervical adenopathy.          Assessment & Plan:

## 2011-10-26 NOTE — Telephone Encounter (Signed)
Notified patient of his appointment at interventional radiology for PICC placement 10/27/11 at 8:30 AM, first dose at Short Stay to follow.  Orders faxed to Short Stay and [facility]. Wendall Mola CMA

## 2011-10-26 NOTE — Assessment & Plan Note (Addendum)
He appears to have ongoing inflammation in his hand. Given his MRI findings from last month will start him on IV therapy for the next month. Will also add doxy to cover for atypical organisms. Unfortunately, there is no good cx data to guide [place]. I spoke with Dr Merrilee Seashore office, there is no further surgery planned. Will have him f/u with them. Will see him back in 4 weeks. Will work with home health to set this up, his insurance status is unclear.

## 2011-10-27 ENCOUNTER — Other Ambulatory Visit: Payer: Self-pay | Admitting: Infectious Diseases

## 2011-10-27 ENCOUNTER — Ambulatory Visit (HOSPITAL_COMMUNITY)
Admission: RE | Admit: 2011-10-27 | Discharge: 2011-10-27 | Disposition: A | Discharge status: Home / Self Care | Payer: Medicaid Other | Source: Ambulatory Visit | Attending: Infectious Diseases | Admitting: Infectious Diseases

## 2011-10-27 ENCOUNTER — Encounter (HOSPITAL_COMMUNITY)
Admission: RE | Admit: 2011-10-27 | Discharge: 2011-10-27 | Disposition: A | Discharge status: Home / Self Care | Payer: Medicaid Other | Source: Ambulatory Visit | Attending: Infectious Diseases | Admitting: Infectious Diseases

## 2011-10-27 DIAGNOSIS — M869 Osteomyelitis, unspecified: Secondary | ICD-10-CM

## 2011-10-27 DIAGNOSIS — L02519 Cutaneous abscess of unspecified hand: Secondary | ICD-10-CM | POA: Insufficient documentation

## 2011-10-27 LAB — CBC
Hemoglobin: 13.6 g/dL (ref 13.0–17.0)
MCV: 93.3 fL (ref 78.0–100.0)
Platelets: 196 10*3/uL (ref 150–400)
RBC: 4.35 MIL/uL (ref 4.22–5.81)
WBC: 7.3 10*3/uL (ref 4.0–10.5)

## 2011-10-27 LAB — BASIC METABOLIC PANEL
CO2: 28 mEq/L (ref 19–32)
Chloride: 104 mEq/L (ref 96–112)
Creatinine, Ser: 1.16 mg/dL (ref 0.50–1.35)
Glucose, Bld: 135 mg/dL — ABNORMAL HIGH (ref 70–99)

## 2011-10-27 MED ORDER — HEPARIN SOD (PORK) LOCK FLUSH 100 UNIT/ML IV SOLN
INTRAVENOUS | Status: AC
  Administered 2011-10-27: 250 [IU] via SUBCUTANEOUS
  Filled 2011-10-27: qty 5

## 2011-10-27 MED ORDER — VANCOMYCIN HCL 1000 MG IV SOLR
1500.0000 mg | INTRAVENOUS | Status: AC
  Administered 2011-10-27: 1500 mg via INTRAVENOUS
  Filled 2011-10-27: qty 1500

## 2011-10-27 MED ORDER — SODIUM CHLORIDE 0.9 % IV SOLN
1.0000 g | INTRAVENOUS | Status: AC
  Administered 2011-10-27: 13:00:00 via INTRAVENOUS
  Filled 2011-10-27: qty 1

## 2011-10-27 NOTE — Procedures (Signed)
Placement of left arm PICC.  Tip in SVC and ready to use.  Length = 45 cm.

## 2011-10-27 NOTE — Progress Notes (Signed)
ANTIBIOTIC CONSULT NOTE - INITIAL  Pharmacy Consult for Vancomycin Indication: right hand osteomyelitis  No Known Allergies  Patient Measurements: Height: 5\' 11"  (180.3 cm) Weight: 248 lb (112.492 kg) IBW/kg (Calculated) : 75.3  Adjusted Body Weight: 85kg  Vital Signs: BP: 127/76 mmHg (01/15 0941) Pulse Rate: 64  (01/15 0941) Intake/Output from previous day:   Intake/Output from this shift:    Labs:  Basename 10/26/11 1143  WBC 7.0  HGB 13.9  PLT 217  LABCREA --  CREATININE 1.18   Estimated Creatinine Clearance: 103 ml/min (by C-G formula based on Cr of 1.18). No results found for this basename: VANCOTROUGH:2,VANCOPEAK:2,VANCORANDOM:2,GENTTROUGH:2,GENTPEAK:2,GENTRANDOM:2,TOBRATROUGH:2,TOBRAPEAK:2,TOBRARND:2,AMIKACINPEAK:2,AMIKACINTROU:2,AMIKACIN:2, in the last 72 hours   Microbiology: No results found for this or any previous visit (from the past 720 hour(s)).  Medical History: No past medical history on file.  Medications:   (Not in a hospital admission) Assessment: 46 y/o male patient with h/o dog bite requiring MRSA coverage for r/o osteomyelitis of right hand. No previous h/o renal insufficiency.  Goal of Therapy:  Vancomycin trough level 15-20 mcg/ml  Plan:  Recommend vancomycin 1500mg  iv q12h and would check trough before 4 dose is due.  Measure antibiotic drug levels at steady state  Verlene Mayer, PharmD, [place] Pager [phone] 10/27/2011,9:49 AM

## 2011-11-05 ENCOUNTER — Telehealth: Payer: Self-pay | Admitting: *Deleted

## 2011-11-05 ENCOUNTER — Encounter: Payer: Self-pay | Admitting: Infectious Diseases

## 2011-11-05 NOTE — Telephone Encounter (Signed)
Amy from [facility] called to get the end date for this pt's vanco. I did not see it in the chart. Pt has an appt here on 12/09/11. Message to md. Do you want to see him sooner? Chart had return in 2 weeks & return in 4 weeks. Do you have an end date for the vanco?

## 2011-11-05 NOTE — Telephone Encounter (Signed)
I spoke with front office. They have an appt 11/17/11 in am or pm. I called the pt & asked him to come in that day as his current f/u date is too far off. He is going to call back today after 3 to schedule the appt

## 2011-11-09 ENCOUNTER — Telehealth: Payer: Self-pay | Admitting: Licensed Clinical Social Worker

## 2011-11-09 NOTE — Telephone Encounter (Signed)
Britta Mccreedy from [facility] called to see if she could have a verbal order to spend extend picc care for the patient until he is done with antibiotics and picc is pulled. I told her that would be fine because the patient will be on antibiotics at LEAST til 11/17/2011 when he has his appointment here at the clinic.

## 2011-11-12 ENCOUNTER — Encounter: Payer: Self-pay | Admitting: Infectious Diseases

## 2011-11-16 ENCOUNTER — Telehealth: Payer: Self-pay | Admitting: *Deleted

## 2011-11-16 NOTE — Telephone Encounter (Signed)
Amy from [facility] called [phone]) to report the pt's last dose of antibiotic was 11/11/11. He broke 6 vials of the med & has not told anyone until today. Amy wants to know if she should bring more of the med to the pt. He has an appt here tomorrow with Dr. Luciana Axe. Has been seeing Dr. Ninetta Lights. Paged Dr. Ninetta Lights.

## 2011-11-17 ENCOUNTER — Ambulatory Visit (INDEPENDENT_AMBULATORY_CARE_PROVIDER_SITE_OTHER): Payer: Medicaid Other | Admitting: Internal Medicine

## 2011-11-17 ENCOUNTER — Encounter: Payer: Self-pay | Admitting: Internal Medicine

## 2011-11-17 ENCOUNTER — Telehealth: Payer: Self-pay | Admitting: *Deleted

## 2011-11-17 VITALS — BP 154/90 | HR 71 | Temp 98.4°F | Wt 249.0 lb

## 2011-11-17 DIAGNOSIS — M869 Osteomyelitis, unspecified: Secondary | ICD-10-CM

## 2011-11-17 NOTE — Telephone Encounter (Signed)
Called [facility] and gave verbal orders per Dr. Luciana Axe to extend patient's IV antibiotics until his appointment with Dr. Ninetta Lights 12/09/11. Wendall Mola CMA

## 2011-11-17 NOTE — Progress Notes (Signed)
  Subjective:    Patient ID: Brian Cain, male    DOB: [birth year], 46 y.o.   MRN: [id number]  HPI He comes in for follow up of osteomyelitis.  He was seen as a new patient 1/14 by Dr. Ninetta Lights with recent concerning findings on MRI and started on ertapenem and doxycycline (with concern for atypical organisms).  Unfortunately, he broke several vials of the antibiotics so missed several days.  Overall though, he does feel better and today has no specific complaints.  He has had some occasional chills but no fever.  His hand has no drainage and is not hurting at all.  It does remain somewhat immobile though.  He has had no rashes, no diarrhea.      Review of Systems  Constitutional: Negative for fever, chills and appetite change.  Gastrointestinal: Negative for nausea, abdominal pain and diarrhea.  Musculoskeletal: Negative for myalgias and arthralgias.  Skin: Negative for rash.  Neurological: Negative for numbness.  Hematological: Negative for adenopathy.       Objective:   Physical Exam  Constitutional: He appears well-developed and well-nourished. No distress.  Musculoskeletal:       Hand with scar, healed, no drainage, non-tender, no erythema          Assessment & Plan:

## 2011-11-17 NOTE — Assessment & Plan Note (Signed)
He is doing well and I have recommended that he continue with the plan of at least 4 weeks of antibiotics as before and let us know if he misses any other doses.  He will return at the end of the treatment and will have the PICC line remaining in place until seen by Dr. Ninetta Lights.

## 2011-11-17 NOTE — Patient Instructions (Signed)
Continue with the antibiotics until seen again here

## 2011-11-20 ENCOUNTER — Encounter: Payer: Self-pay | Admitting: Infectious Diseases

## 2011-11-26 ENCOUNTER — Encounter: Payer: Self-pay | Admitting: Infectious Diseases

## 2011-12-09 ENCOUNTER — Ambulatory Visit (INDEPENDENT_AMBULATORY_CARE_PROVIDER_SITE_OTHER): Payer: Medicaid Other | Admitting: Infectious Diseases

## 2011-12-09 ENCOUNTER — Ambulatory Visit: Payer: Self-pay | Admitting: Infectious Diseases

## 2011-12-09 ENCOUNTER — Encounter: Payer: Self-pay | Admitting: Infectious Diseases

## 2011-12-09 VITALS — BP 128/81 | HR 71 | Temp 98.2°F | Ht 70.5 in | Wt 242.0 lb

## 2011-12-09 DIAGNOSIS — M869 Osteomyelitis, unspecified: Secondary | ICD-10-CM

## 2011-12-09 NOTE — Progress Notes (Signed)
  Subjective:    Patient ID: Brian Cain, male    DOB: 05-04-1966, 46 y.o.   MRN: [id number]  HPI 46 yo M with hx of dog bite to 3rd digit and R hand on 08-20-11. He underwent I & D on 08-22-11, Cx sent (polymicrobial, no staph, strep. AFB Cx -). He was treated with Augmentin. He was seen in f/u on 09-29-11 and was started on a 2 week course of bactrim. He underwent a MRI 09-30-11 to f/u and was found to have concern for osteomyelitis with suspected small 3rd MTP joint effusion (? Septic joint).  Was seen in ID on 10-26-11 and was started on Invanz and doxy. 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 They did not call me or offer an alternative).   Has been having cramping of his R hand, at night, balls up. No swelling, has had mild tenderness. Has occas chills which he attributes to temperatures. No fevers. No problems with PIC line.    Review of Systems     Objective:   Physical Exam  Constitutional: He appears well-developed and well-nourished. No distress.  Musculoskeletal:       Arms:         Assessment & Plan:

## 2011-12-09 NOTE — Assessment & Plan Note (Signed)
He appears to be doing well although he still has some swelling. Will repeat his MRI to see what remains in his hand. He still has some IV rx left, he will continue on this until it has completed (he as clearly had trouble with adherence to this). Will hold on adding doxy/minocycline at this point.  Will see him back in 2 weeks.

## 2011-12-14 ENCOUNTER — Telehealth: Payer: Self-pay | Admitting: *Deleted

## 2011-12-14 NOTE — Telephone Encounter (Signed)
He was to have gotten a MRI of the hand. Pt does not remember being told to get this. He thought he stayed on the antibiotics until he sees md on 12/23/11 & then gets the MRI. Clydie Braun from Dr. [name] called about end date. I called her [phone].  I told her to plan on ending the 13th unless we call & tell her to end earlier  To the md's CMA about the MRI of the hand

## 2011-12-14 NOTE — Telephone Encounter (Signed)
Clydie Braun, a nurse with [facility] called to ask about the stop date of the Vanco. He has a few days left. If to be on it longer, she has to notify the pharmacy to send more out. Has f/u appt here 12/23/11. To md for answer. I will then call her back

## 2011-12-15 ENCOUNTER — Telehealth: Payer: Self-pay | Admitting: *Deleted

## 2011-12-15 NOTE — Telephone Encounter (Signed)
Brian Cain (see below) is from [facility], not [facility]

## 2011-12-15 NOTE — Telephone Encounter (Signed)
Medicaid has denied prior authorization for MRI, stating that an xray has not been done, this is a f/u MRI. Tried to do appeal and had to leave a voicemail, left my extension [phone]. Wendall Mola CMA

## 2011-12-17 ENCOUNTER — Telehealth: Payer: Self-pay | Admitting: *Deleted

## 2011-12-17 NOTE — Telephone Encounter (Signed)
An appeal was done for patient's MRI which was denied, appeal also denied.  Will ask Dr. Ninetta Lights to do a peer to peer review to try and have this approved. Wendall Mola CMA

## 2011-12-18 ENCOUNTER — Telehealth: Payer: Self-pay | Admitting: *Deleted

## 2011-12-18 NOTE — Telephone Encounter (Signed)
Called patient and had to leave a voice mail, his MRI was approved, authorization # [id number].  Scheduled for Tuesday 12/22/11 at 5:00 pm, [facility].  His appointment with Dr. Ninetta Lights is 12/23/11 at 9:15 AM.  Requested that patient return my call to let me know he got this message. Brian Cain CMA

## 2011-12-18 NOTE — Telephone Encounter (Signed)
Patient informed of MRI appointment for 12/22/11 at 5:00 pm at [facility]. Wendall Mola CMA

## 2011-12-21 ENCOUNTER — Ambulatory Visit: Payer: Medicaid Other | Admitting: Infectious Diseases

## 2011-12-21 ENCOUNTER — Emergency Department (HOSPITAL_COMMUNITY)
Admission: EM | Admit: 2011-12-21 | Discharge: 2011-12-22 | Disposition: A | Discharge status: Home Health | Payer: Medicaid Other | Attending: Emergency Medicine | Admitting: Emergency Medicine

## 2011-12-21 ENCOUNTER — Telehealth: Payer: Self-pay | Admitting: *Deleted

## 2011-12-21 ENCOUNTER — Emergency Department (HOSPITAL_COMMUNITY): Payer: Medicaid Other

## 2011-12-21 ENCOUNTER — Encounter (HOSPITAL_COMMUNITY): Payer: Self-pay | Admitting: *Deleted

## 2011-12-21 DIAGNOSIS — S61219A Laceration without foreign body of unspecified finger without damage to nail, initial encounter: Secondary | ICD-10-CM

## 2011-12-21 DIAGNOSIS — S61209A Unspecified open wound of unspecified finger without damage to nail, initial encounter: Secondary | ICD-10-CM | POA: Insufficient documentation

## 2011-12-21 DIAGNOSIS — IMO0002 Reserved for concepts with insufficient information to code with codable children: Secondary | ICD-10-CM | POA: Insufficient documentation

## 2011-12-21 NOTE — ED Provider Notes (Signed)
History     CSN: [id number]  Arrival date & time 12/21/11  7846   First MD Initiated Contact with Patient 12/21/11 2252      Chief Complaint  Patient presents with  . Laceration     HPI  History provided by the patient. Patient is a 46 year old male with history of right hand dog bite and infection currently receiving IV antibiotics presents with complaints of left finger injury and laceration that occurred 2 days ago. Patient reports smashing fingers of left hand in the door on Saturday. Since that time he is inclined to rest hand and keep the small cuts clean with peroxide. Patient continued to be concerned about the cuts and wanted further evaluation. He denies having any drainage or bleeding from the area. Patient is current on tetanus. Patient denies any aggravating or alleviating factors. Symptoms are described as mild to moderate.    History reviewed. No pertinent past medical history.  Past Surgical History  Procedure Date  . I&d extremity 08/22/2011    Procedure: IRRIGATION AND DEBRIDEMENT EXTREMITY;  Surgeon: Tami Ribas;  Location: [facility] OR;  Service: Orthopedics;  Laterality: Right;    Family History  Problem Relation Age of Onset  . Cancer Mother     breast    History  Substance Use Topics  . Smoking status: Former Smoker -- 0.3 packs/day for 3 years    Types: Cigarettes    Quit date: 11/27/2011  . Smokeless tobacco: Never Used  . Alcohol Use: No      Review of Systems  All other systems reviewed and are negative.    Allergies  Review of patient's allergies indicates no known allergies.  Home Medications   Current Outpatient Rx  Name Route Sig Dispense Refill  . ERTAPENEM 1 GM IVPB Intravenous Inject 1 g into the vein daily.      BP 121/71  Pulse 82  Temp(Src) 98.8 F (37.1 C) (Oral)  Resp 18  SpO2 98%  Physical Exam  Nursing note and vitals reviewed. Constitutional: He is oriented to person, place, and time. He appears well-developed  and well-nourished. No distress.  HENT:  Head: Normocephalic.  Cardiovascular: Normal rate and regular rhythm.   Pulmonary/Chest: Effort normal and breath sounds normal.  Musculoskeletal:       Moderate swelling of left third and fourth fingers. Full range of motion normal strength. Normal distal sensations and cap refill less than 2 seconds. 1.5 cm laceration over anterior surface a second digit near PIP joint. Superficial abrasion over anterior fourth finger. No erythema skin erythematous streaks. No drainage or bleeding.  Neurological: He is alert and oriented to person, place, and time.  Psychiatric: He has a normal mood and affect. His behavior is normal.    ED Course  Procedures   LACERATION REPAIR Performed by: Angus Seller Authorized by: Angus Seller Consent: Verbal consent obtained. Risks and benefits: risks, benefits and alternatives were discussed Consent given by: patient Patient identity confirmed: provided demographic data Prepped and Draped in normal sterile fashion Wound explored  Laceration Location: Left anterior middle finger  Laceration Length: 1.5 cm  No Foreign Bodies seen or palpated  Anesthesia: local infiltration  Local anesthetic: lidocaine 2 % with a epinephrine  Anesthetic total: 1 ml  Irrigation method: syringe Amount of cleaning: standard  Skin closure: Loose closure with 4-0 Prolene   Number of sutures: 1   Technique: Simple interrupted   Patient tolerance: Patient tolerated the procedure well with no immediate complications.  Dg Finger Middle Left  12/22/2011  *RADIOLOGY REPORT*  Clinical Data: Laceration to the third and fourth digits  LEFT MIDDLE FINGER 2+V  Comparison: None.  Findings: Images of the third digit show no acute fracture or dislocation.  No radiopaque foreign body.  IMPRESSION: No acute osseous abnormality or radiopaque foreign body identified.  Original Report Authenticated By: Waneta Martins, M.D.     1.  Laceration of finger       MDM  11:00 PM patient seen and evaluated. Patient no acute distress. Patient reports being up-to-date on tetanus        Angus Seller, [place] 12/23/11 [phone]

## 2011-12-21 NOTE — Telephone Encounter (Signed)
Brian Cain with [facility] called to say she cannot get labs tonight as he is on hi sway to the ED. He hit his other hand & is going to get stitches. She will get the labs tomorrow so we will have them when he comes here Wednesday for his md visit

## 2011-12-21 NOTE — ED Notes (Signed)
The pt slammed his lt hand in a door on Saturday.  He has pain to his lt ring and middle fingers.  He is saying there are cuts he has bandages to them

## 2011-12-22 ENCOUNTER — Ambulatory Visit (HOSPITAL_COMMUNITY)
Admission: RE | Admit: 2011-12-22 | Discharge: 2011-12-22 | Disposition: A | Discharge status: Home / Self Care | Payer: Medicaid Other | Source: Ambulatory Visit | Attending: Infectious Diseases | Admitting: Infectious Diseases

## 2011-12-22 ENCOUNTER — Other Ambulatory Visit: Payer: Self-pay | Admitting: Infectious Diseases

## 2011-12-22 DIAGNOSIS — M869 Osteomyelitis, unspecified: Secondary | ICD-10-CM

## 2011-12-22 NOTE — ED Notes (Signed)
Patient given discharge paperwork; went over discharge instructions with patient.  Patient instructed to follow up with primary care physician, [facility], or orthopedic hand specialist for suture removal in 10 days; instructed to keep sutures and wound clean and dry; instructed to watch for signs/symptoms of infection.  Patient advised to return to the ED for new, worsening, or concerning symptoms.

## 2011-12-22 NOTE — Discharge Instructions (Signed)
You were seen and treated today for your laceration of your fingers. Your x-rays do not show any broken bones from your injury. You'll need to have your suture removed in 10 days. Please followup with your primary doctor or orthopedic hand specialist.  Laceration Care, Adult A laceration is a cut or lesion that goes through all layers of the skin and into the tissue just beneath the skin. TREATMENT  Some lacerations may not require closure. Some lacerations may not be able to be closed due to an increased risk of infection. It is important to see your caregiver as soon as possible after an injury to minimize the risk of infection and maximize the opportunity for successful closure. If closure is appropriate, pain medicines may be given, if needed. The wound will be cleaned to help prevent infection. Your caregiver will use stitches (sutures), staples, wound glue (adhesive), or skin adhesive strips to repair the laceration. These tools bring the skin edges together to allow for faster healing and a better cosmetic outcome. However, all wounds will heal with a scar. Once the wound has healed, scarring can be minimized by covering the wound with sunscreen during the day for 1 full year. HOME CARE INSTRUCTIONS  For sutures or staples:  Keep the wound clean and dry.   If you were given a bandage (dressing), you should change it at least once a day. Also, change the dressing if it becomes wet or dirty, or as directed by your caregiver.   Wash the wound with soap and water 2 times a day. Rinse the wound off with water to remove all soap. Pat the wound dry with a clean towel.   After cleaning, apply a thin layer of the antibiotic ointment as recommended by your caregiver. This will help prevent infection and keep the dressing from sticking.   You may shower as usual after the first 24 hours. Do not soak the wound in water until the sutures are removed.   Only take over-the-counter or prescription  medicines for pain, discomfort, or fever as directed by your caregiver.   Get your sutures or staples removed as directed by your caregiver.  For skin adhesive strips:  Keep the wound clean and dry.   Do not get the skin adhesive strips wet. You may bathe carefully, using caution to keep the wound dry.   If the wound gets wet, pat it dry with a clean towel.   Skin adhesive strips will fall off on their own. You may trim the strips as the wound heals. Do not remove skin adhesive strips that are still stuck to the wound. They will fall off in time.  For wound adhesive:  You may briefly wet your wound in the shower or bath. Do not soak or scrub the wound. Do not swim. Avoid periods of heavy perspiration until the skin adhesive has fallen off on its own. After showering or bathing, gently pat the wound dry with a clean towel.   Do not apply liquid medicine, cream medicine, or ointment medicine to your wound while the skin adhesive is in place. This may loosen the film before your wound is healed.   If a dressing is placed over the wound, be careful not to apply tape directly over the skin adhesive. This may cause the adhesive to be pulled off before the wound is healed.   Avoid prolonged exposure to sunlight or tanning lamps while the skin adhesive is in place. Exposure to ultraviolet light in the  first year will darken the scar.   The skin adhesive will usually remain in place for 5 to 10 days, then naturally fall off the skin. Do not pick at the adhesive film.  You may need a tetanus shot if:  You cannot remember when you had your last tetanus shot.   You have never had a tetanus shot.  If you get a tetanus shot, your arm may swell, get red, and feel warm to the touch. This is common and not a problem. If you need a tetanus shot and you choose not to have one, there is a rare chance of getting tetanus. Sickness from tetanus can be serious. SEEK MEDICAL CARE IF:   You have redness,  swelling, or increasing pain in the wound.   You see a red line that goes away from the wound.   You have yellowish-white fluid (pus) coming from the wound.   You have a fever.   You notice a bad smell coming from the wound or dressing.   Your wound breaks open before or after sutures have been removed.   You notice something coming out of the wound such as wood or glass.   Your wound is on your hand or foot and you cannot move a finger or toe.  SEEK IMMEDIATE MEDICAL CARE IF:   Your pain is not controlled with prescribed medicine.   You have severe swelling around the wound causing pain and numbness or a change in color in your arm, hand, leg, or foot.   Your wound splits open and starts bleeding.   You have worsening numbness, weakness, or loss of function of any joint around or beyond the wound.   You develop painful lumps near the wound or on the skin anywhere on your body.  MAKE SURE YOU:   Understand these instructions.   Will watch your condition.   Will get help right away if you are not doing well or get worse.  Document Released: 09/28/2005 Document Revised: 09/17/2011 Document Reviewed: 03/24/2011 [facility] Patient Information 2012 [place], [place].

## 2011-12-23 ENCOUNTER — Ambulatory Visit (INDEPENDENT_AMBULATORY_CARE_PROVIDER_SITE_OTHER): Payer: Medicaid Other | Admitting: Infectious Diseases

## 2011-12-23 ENCOUNTER — Telehealth: Payer: Self-pay | Admitting: *Deleted

## 2011-12-23 ENCOUNTER — Encounter: Payer: Self-pay | Admitting: Infectious Diseases

## 2011-12-23 VITALS — BP 126/78 | HR 67 | Temp 97.5°F | Ht 71.0 in | Wt 248.0 lb

## 2011-12-23 DIAGNOSIS — M869 Osteomyelitis, unspecified: Secondary | ICD-10-CM

## 2011-12-23 NOTE — Telephone Encounter (Signed)
Called [facility] to have prior authorization changed from [facility] to [facility], pt. Needed open MRI, authorization # [id number]. Wendall Mola CMA

## 2011-12-23 NOTE — ED Provider Notes (Signed)
Medical screening examination/treatment/procedure(s) were performed by non-physician practitioner and as supervising physician I was immediately available for consultation/collaboration.  Aviva Wolfer, MD 12/23/11 0957 

## 2011-12-23 NOTE — Assessment & Plan Note (Signed)
Unfortunately, further planning for him is delayed by his MRI delay. Will get MRI on 3-17. Will call pt back on 3-18 and make further plans based on his MRI. Will ask home care to keep his PIC patent until that time.

## 2011-12-23 NOTE — Telephone Encounter (Signed)
Spoke to [facility] Nurse Dianne and gave orders to flush PICC through 12/28/11, Dr. Ninetta Lights will then review MRI and decide whether to continue antibiotics or pull PICC. Wendall Mola CMA

## 2011-12-23 NOTE — Progress Notes (Signed)
  Subjective:    Patient ID: Brian Cain, male    DOB: [birth year], 46 y.o.   MRN: [id number]  HPI 46 yo M with hx of dog bite to 3rd digit and R hand on 08-20-11. He underwent I & D on 08-22-11, Cx sent (polymicrobial, no staph, strep. AFB Cx -). He was treated with Augmentin. He was seen in f/u on 09-29-11 and was started on a 2 week course of bactrim. He underwent a MRI 09-30-11 to f/u and was found to have concern for osteomyelitis with suspected small 3rd MTP joint effusion (? Septic joint).  Was seen in ID on 10-26-11 and was started on Invanz and doxy. 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 They did not call me or offer an alternative).  He was seen 12-09-11 in ID and a repeat MRI was ordered to f/u his bony changes in his hand. This was delayed due to needing pre-authorization and then he was in scanner yesterday but radiology was unable to fit the patients shoulders into the machine.  Feels like his hand is doing better. Has one more day of Invanz.  No f/c. Feels like his digit is swollen, has limitation of movement. Is worried that his joint has receded proximally.  No problems with PIC line   Review of Systems     Objective:   Physical Exam  Constitutional: He appears well-developed and well-nourished. No distress.  Musculoskeletal:       Arms:         Assessment & Plan:

## 2011-12-25 ENCOUNTER — Ambulatory Visit
Admission: RE | Admit: 2011-12-25 | Discharge: 2011-12-25 | Disposition: A | Discharge status: Home / Self Care | Payer: Medicaid Other | Source: Ambulatory Visit | Attending: Infectious Diseases | Admitting: Infectious Diseases

## 2011-12-25 ENCOUNTER — Other Ambulatory Visit: Payer: Self-pay | Admitting: Infectious Diseases

## 2011-12-25 ENCOUNTER — Encounter: Payer: Self-pay | Admitting: Infectious Diseases

## 2011-12-25 ENCOUNTER — Telehealth: Payer: Self-pay | Admitting: *Deleted

## 2011-12-25 DIAGNOSIS — M869 Osteomyelitis, unspecified: Secondary | ICD-10-CM

## 2011-12-25 NOTE — Telephone Encounter (Signed)
Patient went for MRI at [facility] and could not have it done, because he injured his shoulder and could not raise it in the position needed.  Called [facility] at [phone] auth. # [id number] and had location changed to [facility] on [facility]. Which has a different open MRI and he can leave his arm at his side.  Appointment scheduled for Tuesday 12/29/11 at 8:15 AM.  Patient notified. Wendall Mola CMA

## 2011-12-27 ENCOUNTER — Other Ambulatory Visit: Payer: Medicaid Other

## 2011-12-28 ENCOUNTER — Telehealth: Payer: Self-pay | Admitting: *Deleted

## 2011-12-28 ENCOUNTER — Ambulatory Visit: Payer: Medicaid Other | Admitting: Infectious Diseases

## 2011-12-28 NOTE — Telephone Encounter (Signed)
[facility] RN, Clydie Braun, wanting to know the status of pt's PIC orders.  Spoke with Britt Boozer, CMA.  She had already spoken with the [facility] pharmacy this AM.  RN spoke with Eunice Blase, pharmacist at [facility].  Will continue current plan until results from MRI are available.

## 2011-12-28 NOTE — Telephone Encounter (Signed)
Patient called back he is aware of MRI appointment for tomorrow at [facility]. Wendall Mola CMA

## 2011-12-28 NOTE — Telephone Encounter (Signed)
Called [facility] and gave verbal order to flush PICC through 12/29/11. Also called [facility] to have MRI results faxed to this clinic.  Patient was left voice mail regarding appointment at [facility] for 12/29/11 at 8:15 AM on 12/25/11.  Asked to call me to verify he received the message, as of 12/28/11 he has not returned the call. Emergency contact can not receive calls. Wendall Mola CMA

## 2011-12-29 ENCOUNTER — Other Ambulatory Visit: Payer: Self-pay | Admitting: *Deleted

## 2011-12-29 ENCOUNTER — Telehealth: Payer: Self-pay | Admitting: *Deleted

## 2011-12-29 NOTE — Telephone Encounter (Signed)
Verbal order called to Katie at [facility] to restart patient's IV antibiotics and continue x 6 weeks.  Patient given appointment for 01/01/12 and is aware of his MRI results which showed ongoing osteomyelitis, also aware [facility] will be calling him to restart antibiotics. Wendall Mola CMA

## 2011-12-29 NOTE — Telephone Encounter (Signed)
Pt called, mri still shows osteo. He will resume anbx, return to clinic on Friday. thanks

## 2012-01-01 ENCOUNTER — Ambulatory Visit (INDEPENDENT_AMBULATORY_CARE_PROVIDER_SITE_OTHER): Payer: Medicaid Other | Admitting: Infectious Diseases

## 2012-01-01 ENCOUNTER — Encounter: Payer: Self-pay | Admitting: Infectious Diseases

## 2012-01-01 VITALS — BP 135/87 | HR 73 | Temp 97.5°F | Resp 18 | Ht 71.0 in | Wt 249.8 lb

## 2012-01-01 DIAGNOSIS — M869 Osteomyelitis, unspecified: Secondary | ICD-10-CM

## 2012-01-01 NOTE — Assessment & Plan Note (Signed)
Will plan to continue his vanco and invaz for an additional month. Consider repeat MRI then. He needs to f/u with his hand surgeon in the intervening period (does he need eval of his tendon stability?). rtc 1 month

## 2012-01-01 NOTE — Progress Notes (Signed)
  Subjective:    Patient ID: Brian Cain, male    DOB: Oct 26, 1965, 46 y.o.   MRN: [id number]  HPI  46 yo M with hx of dog bite to 3rd digit and R hand on 08-20-11. He underwent I & D on 08-22-11, Cx sent (polymicrobial, no staph, strep. AFB Cx -). He was treated with Augmentin. He was seen in f/u on 09-29-11 and was started on a 2 week course of bactrim. He underwent a MRI 09-30-11 to f/u and was found to have concern for osteomyelitis with suspected small 3rd MTP joint effusion (? Septic joint).  Was seen in ID on 10-26-11 and was started on Invanz and doxy. 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 (They did not call me or offer an alternative).  He was seen 12-09-11 in ID and a repeat MRI was ordered to f/u his bony changes in his hand. This was delayed due to needing pre-authorization and then he was in scanner yesterday but radiology was unable to fit the patients shoulders into the machine.   He denies F/C today. He still has some limitation of motion of his finger. Can extend fully by using his L hand to pull finger straight. He has noticed swelling of the joint.  His PIC line has been infusing slowly, says that it takes up to 2 hours for the medicine to go in.  Recently closed his L hand in a door and required sutures on his fingers for this. He describes a feeling that his R 3rd digit is rotating, ? Unstable.  MRI (12-29-11): Findings are consistent with osteomyelitis of the 3rd digit. 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   Review of Systems     Objective:   Physical Exam  Constitutional: He appears well-developed and well-nourished.  Musculoskeletal:       Arms: Lymphadenopathy:    He has no axillary adenopathy.       Right: No epitrochlear adenopathy present.          Assessment & Plan:

## 2012-01-07 ENCOUNTER — Telehealth: Payer: Self-pay | Admitting: *Deleted

## 2012-01-07 NOTE — Telephone Encounter (Signed)
Dr. Ninetta Lights spoke with [facility] and gave order for cath flow. Wendall Mola CMA

## 2012-01-07 NOTE — Telephone Encounter (Signed)
Clydie Braun, a nurse with [facility] called [phone]) to report that  she is unable to draw from the PICC to get labs. Having to stick. His last lab was "off" he is not taking the meds. Admitted to not doing them Monday, took half a bag Tuesday & none Wednesday. Wants an order for Cathflo and wants to know what to do since the pt is not self administering the drug. I will call her when I get the md response

## 2012-01-18 ENCOUNTER — Encounter: Payer: Self-pay | Admitting: Infectious Diseases

## 2012-01-22 ENCOUNTER — Encounter: Payer: Self-pay | Admitting: Infectious Diseases

## 2012-01-27 ENCOUNTER — Encounter: Payer: Self-pay | Admitting: Infectious Diseases

## 2012-02-01 ENCOUNTER — Telehealth: Payer: Self-pay | Admitting: *Deleted

## 2012-02-01 ENCOUNTER — Encounter: Payer: Self-pay | Admitting: Infectious Diseases

## 2012-02-01 ENCOUNTER — Ambulatory Visit: Payer: Medicaid Other | Admitting: Infectious Diseases

## 2012-02-01 ENCOUNTER — Ambulatory Visit (INDEPENDENT_AMBULATORY_CARE_PROVIDER_SITE_OTHER): Payer: Medicaid Other | Admitting: Infectious Diseases

## 2012-02-01 VITALS — BP 150/91 | HR 56 | Temp 98.0°F | Ht 71.0 in | Wt 240.0 lb

## 2012-02-01 DIAGNOSIS — M869 Osteomyelitis, unspecified: Secondary | ICD-10-CM

## 2012-02-01 MED ORDER — DOXYCYCLINE HYCLATE 100 MG PO TABS: 100.0000 mg | ORAL_TABLET | Freq: Two times a day (BID) | ORAL | Status: DC

## 2012-02-01 NOTE — Assessment & Plan Note (Addendum)
His hand is relatively stable. Will continue his anbx at this point to complete 6 weeks of current rx (stop medications, pull PIC on May 3). He will return in 1 month to re-assess his progress. Start him on doxy when he finishes PO. Have him f/u with Dr Merlyn Lot as well.

## 2012-02-01 NOTE — Telephone Encounter (Signed)
Called [facility] and gave verbal order per Dr. Ninetta Lights to continue IV antibiotics until 02/12/12 and then to pull PICC. Wendall Mola CMA

## 2012-02-01 NOTE — Progress Notes (Signed)
  Subjective:    Patient ID: Brian Cain, male    DOB: 05/19/1966, 46 y.o.   MRN: [id number]  HPI 46 yo M with hx of dog bite to 3rd digit and R hand on 08-20-11. He underwent I & D on 08-22-11, Cx sent (polymicrobial, no staph, strep. AFB Cx -). He was treated with Augmentin. He was seen in f/u on 09-29-11 and was started on a 2 week course of bactrim. He underwent a MRI 09-30-11 to f/u and was found to have concern for osteomyelitis with suspected small 3rd MTP joint effusion (? Septic joint).  Was seen in ID on 10-26-11 and was started on Invanz and doxy. 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 (They did not call me or offer an alternative).  He was seen 12-09-11 in ID and a repeat MRI was ordered to f/u his bony changes in his hand. This was delayed due to pre-authorization issues. MRI (12-29-11): Findings are consistent with osteomyelitis of the 3rd digit. 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  He denies any missed doses of his anbx.  Feels a knot in his hand/finger. Has some pain intermittently. No problems with his anbx (current IV anbx started 3-19).   No f/c, no erythema at [facility]. No infusion problems.    Review of Systems     Objective:   Physical Exam  Constitutional: He appears well-developed and well-nourished.  Musculoskeletal:       Arms:         Assessment & Plan:

## 2012-02-01 NOTE — Telephone Encounter (Signed)
Called patient and notified him of appointment with Dr. Merlyn Lot, surgeon, for this Wednesday 02/03/12 at 10:15 AM. Wendall Mola CMA

## 2012-02-11 ENCOUNTER — Telehealth: Payer: Self-pay | Admitting: *Deleted

## 2012-02-11 NOTE — Telephone Encounter (Signed)
Nurse Clydie Braun with [facility] called and asked if she is to remove the patients Picc or is he to come to the clinic and have it done. Called her back not able to get her as she is out in the field.

## 2012-02-22 ENCOUNTER — Encounter: Payer: Self-pay | Admitting: Infectious Diseases

## 2012-03-09 ENCOUNTER — Encounter: Payer: Self-pay | Admitting: Infectious Diseases

## 2012-03-09 ENCOUNTER — Ambulatory Visit (INDEPENDENT_AMBULATORY_CARE_PROVIDER_SITE_OTHER): Payer: Medicaid Other | Admitting: Infectious Diseases

## 2012-03-09 VITALS — BP 134/82 | HR 62 | Temp 97.7°F | Ht 71.0 in | Wt 249.0 lb

## 2012-03-09 DIAGNOSIS — M869 Osteomyelitis, unspecified: Secondary | ICD-10-CM

## 2012-03-09 MED ORDER — SULFAMETHOXAZOLE-TMP DS 800-160 MG PO TABS: 1.0000 | ORAL_TABLET | Freq: Two times a day (BID) | ORAL | Status: AC

## 2012-03-09 NOTE — Progress Notes (Signed)
  Subjective:    Patient ID: Brian Cain, male    DOB: August 11, 1966, 46 y.o.   MRN: [id number]  HPI 46 yo M with hx of dog bite to 3rd digit and R hand on 08-20-11. He underwent I & D on 08-22-11, Cx sent (polymicrobial, no staph, strep. AFB Cx -). He was treated with Augmentin. He was seen in f/u on 09-29-11 and was started on a 2 week course of bactrim. He underwent a MRI 09-30-11 to f/u and was found to have concern for osteomyelitis with suspected small 3rd MTP joint effusion (? Septic joint).  Was seen in ID on 10-26-11 and was started on Invanz and doxy. 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  He was seen 12-09-11 in ID and a repeat MRI was ordered to f/u his bony changes in his hand. This was delayed due to pre-authorization issues. MRI (12-29-11): Findings are consistent with osteomyelitis of the 3rd digit. 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 .  Current IV anbx started 3-19. He completed his anbx on 5-3, his pic was pulled.   Feels a knot in his hand/finger. Has some pain intermittently   Review of Systems     Objective:   Physical Exam  Constitutional: He appears well-nourished. No distress.  Musculoskeletal:       Arms:         Assessment & Plan:

## 2012-03-09 NOTE — Assessment & Plan Note (Addendum)
He has improved his range of motion. Some concern about his mild warmth and mild pain. Will cont him on po anbx for the nxt 3 months, see him back then. I've asked him to f/u with his hand surgeon in the intervening period as well. Pt will call if any worsening, at which time we will consider a repeat MRI of his hand.

## 2012-04-11 ENCOUNTER — Ambulatory Visit: Payer: Medicaid Other | Admitting: Infectious Diseases

## 2012-06-08 ENCOUNTER — Ambulatory Visit (INDEPENDENT_AMBULATORY_CARE_PROVIDER_SITE_OTHER): Payer: Self-pay | Admitting: Infectious Diseases

## 2012-06-08 ENCOUNTER — Encounter: Payer: Self-pay | Admitting: Infectious Diseases

## 2012-06-08 VITALS — BP 133/84 | HR 70 | Temp 98.1°F | Ht 72.0 in | Wt 243.0 lb

## 2012-06-08 DIAGNOSIS — M869 Osteomyelitis, unspecified: Secondary | ICD-10-CM

## 2012-06-08 DIAGNOSIS — T63461A Toxic effect of venom of wasps, accidental (unintentional), initial encounter: Secondary | ICD-10-CM

## 2012-06-08 DIAGNOSIS — T6391XA Toxic effect of contact with unspecified venomous animal, accidental (unintentional), initial encounter: Secondary | ICD-10-CM

## 2012-06-08 DIAGNOSIS — T63441A Toxic effect of venom of bees, accidental (unintentional), initial encounter: Secondary | ICD-10-CM | POA: Insufficient documentation

## 2012-06-08 NOTE — Assessment & Plan Note (Signed)
Encouraged him to ice, elevate, take benadryl.

## 2012-06-08 NOTE — Progress Notes (Signed)
  Subjective:    Patient ID: ANTHEM FRAZER, male    DOB: [birth year], 46 y.o.   MRN: [id number]  HPI 45 yo M with hx of dog bite to 3rd digit and R hand on 08-20-11. He underwent I & D on 08-22-11, Cx sent (polymicrobial, no staph, strep. AFB Cx -). He was treated with Augmentin. He was seen in f/u on 09-29-11 and was started on a 2 week course of bactrim. He underwent a MRI 09-30-11 to f/u and was found to have concern for osteomyelitis with suspected small 3rd MTP joint effusion (? Septic joint).  Was seen in ID on 10-26-11 and was started on Invanz and doxy. 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  He was seen 12-09-11 in ID and a repeat MRI was ordered to f/u his bony changes in his hand. This was delayed due to pre-authorization issues. MRI (12-29-11): Findings are consistent with osteomyelitis of the 3rd digit. 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 .  Current IV anbx started 3-19. He completed his anbx on 5-3, his pic was pulled. He was seen in f/u May 2013 and was given bactrim to continue for 3 months due to continued warmth of his hand.  Today with concerns about his RLE, was stung 4x by bees yesterday and he is not able to wear socks! Has continued tightness of his R hand, with exertion of his hand. Getting same feelings in his L hand now as well. No fever or chills. Had some pain in his R hand and shoulder with recent weather change. Has completed his anbx.  ROM is improved.     Review of Systems     Objective:   Physical Exam  Constitutional: He appears well-developed and well-nourished.  Musculoskeletal:       Arms:      Feet:          Assessment & Plan:

## 2012-06-08 NOTE — Assessment & Plan Note (Signed)
He is improved. Will see him back prn. Encouraged him to try and exercise his ROM. He may go back to [employer].

## 2013-04-10 IMAGING — US IR CENTRAL LINE *L*
1 series · 1 of 1 positions shown · non-contrast
Comparison: none

CLINICAL DATA: Right hand osteomyelitis.  Needs IV antibiotics.

[Series 1: ir central line *left* · 1 of 1 slices shown]
[im 1/1]
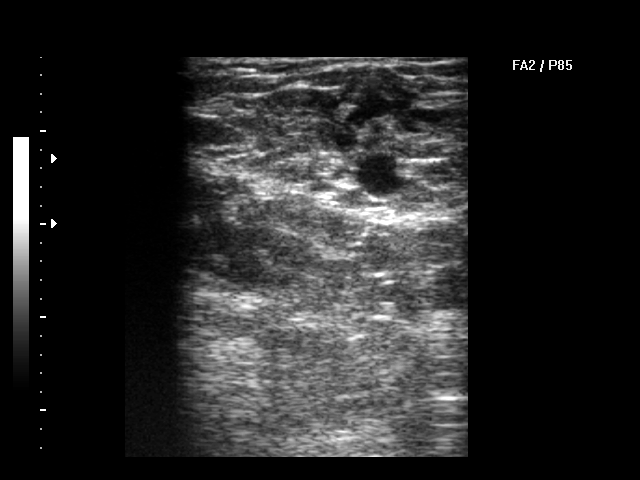

[1 of 1 positions shown; findings below may reference images not displayed]

PICC LINE PLACEMENT WITH ULTRASOUND AND FLUOROSCOPIC  GUIDANCE

Fluoroscopy Time: 0.2 minutes.

The left arm was prepped with chlorhexidine, draped in the usual
sterile fashion using maximum barrier technique (cap and mask,
sterile gown, sterile gloves, large sterile sheet, hand hygiene and
cutaneous antisepsis) and infiltrated locally with 1% Lidocaine.

Ultrasound demonstrated patency of the left basilic vein, and this
was documented with an image.  Under real-time ultrasound guidance,
this vein was accessed with a 21 gauge micropuncture needle and
image documentation was performed.  The needle was exchanged over a
guidewire for a peel-away sheath through which a five French single
lumen PICC trimmed to 45 cm was advanced, positioned with its tip
in the SVC.  Fluoroscopy during the procedure and fluoro spot
radiograph confirms appropriate catheter position.  The catheter
was flushed, secured to the skin with Prolene sutures, and covered
with a sterile dressing.

Complications:  None
IMPRESSION: Successful left arm PICC line placement with ultrasound and
fluoroscopic guidance.  The catheter is ready for use.

## 2013-06-10 ENCOUNTER — Emergency Department (HOSPITAL_COMMUNITY): Payer: Self-pay

## 2013-06-10 ENCOUNTER — Encounter (HOSPITAL_COMMUNITY): Payer: Self-pay

## 2013-06-10 ENCOUNTER — Emergency Department (HOSPITAL_COMMUNITY)
Admission: EM | Admit: 2013-06-10 | Discharge: 2013-06-10 | Disposition: A | Discharge status: Home / Self Care | Payer: Self-pay | Attending: Emergency Medicine | Admitting: Emergency Medicine

## 2013-06-10 DIAGNOSIS — Z87891 Personal history of nicotine dependence: Secondary | ICD-10-CM | POA: Insufficient documentation

## 2013-06-10 DIAGNOSIS — W108XXA Fall (on) (from) other stairs and steps, initial encounter: Secondary | ICD-10-CM | POA: Insufficient documentation

## 2013-06-10 DIAGNOSIS — S01409A Unspecified open wound of unspecified cheek and temporomandibular area, initial encounter: Secondary | ICD-10-CM | POA: Insufficient documentation

## 2013-06-10 DIAGNOSIS — S0181XA Laceration without foreign body of other part of head, initial encounter: Secondary | ICD-10-CM

## 2013-06-10 DIAGNOSIS — IMO0002 Reserved for concepts with insufficient information to code with codable children: Secondary | ICD-10-CM

## 2013-06-10 DIAGNOSIS — S0990XA Unspecified injury of head, initial encounter: Secondary | ICD-10-CM | POA: Insufficient documentation

## 2013-06-10 DIAGNOSIS — S4980XA Other specified injuries of shoulder and upper arm, unspecified arm, initial encounter: Secondary | ICD-10-CM | POA: Insufficient documentation

## 2013-06-10 DIAGNOSIS — Y9289 Other specified places as the place of occurrence of the external cause: Secondary | ICD-10-CM | POA: Insufficient documentation

## 2013-06-10 DIAGNOSIS — S0003XA Contusion of scalp, initial encounter: Secondary | ICD-10-CM | POA: Insufficient documentation

## 2013-06-10 DIAGNOSIS — Y9389 Activity, other specified: Secondary | ICD-10-CM | POA: Insufficient documentation

## 2013-06-10 DIAGNOSIS — S46909A Unspecified injury of unspecified muscle, fascia and tendon at shoulder and upper arm level, unspecified arm, initial encounter: Secondary | ICD-10-CM | POA: Insufficient documentation

## 2013-06-10 MED ORDER — OXYCODONE-ACETAMINOPHEN 5-325 MG PO TABS
2.0000 | ORAL_TABLET | Freq: Once | ORAL | Status: AC
  Administered 2013-06-10: 2 via ORAL
  Filled 2013-06-10: qty 2

## 2013-06-10 MED ORDER — IBUPROFEN 600 MG PO TABS: 600.0000 mg | ORAL_TABLET | Freq: Four times a day (QID) | ORAL | Status: DC | PRN

## 2013-06-10 MED ORDER — FENTANYL CITRATE 0.05 MG/ML IJ SOLN
100.0000 ug | Freq: Once | INTRAMUSCULAR | Status: AC
Start: 2013-06-10 — End: 2013-06-10
  Administered 2013-06-10: 100 ug via INTRAVENOUS
  Filled 2013-06-10: qty 2

## 2013-06-10 MED ORDER — KETOROLAC TROMETHAMINE 30 MG/ML IJ SOLN
30.0000 mg | Freq: Once | INTRAMUSCULAR | Status: AC
  Administered 2013-06-10: 30 mg via INTRAVENOUS
  Filled 2013-06-10: qty 1

## 2013-06-10 MED ORDER — OXYCODONE-ACETAMINOPHEN 5-325 MG PO TABS: 1.0000 | ORAL_TABLET | Freq: Four times a day (QID) | ORAL | Status: DC | PRN
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
## 2014-01-09 ENCOUNTER — Emergency Department (HOSPITAL_COMMUNITY): Payer: Self-pay

## 2014-01-09 ENCOUNTER — Emergency Department (HOSPITAL_COMMUNITY)
Admission: EM | Admit: 2014-01-09 | Discharge: 2014-01-09 | Disposition: A | Discharge status: Home / Self Care | Payer: Self-pay | Attending: Emergency Medicine | Admitting: Emergency Medicine

## 2014-01-09 ENCOUNTER — Encounter (HOSPITAL_COMMUNITY): Payer: Self-pay | Admitting: Emergency Medicine

## 2014-01-09 DIAGNOSIS — S51809A Unspecified open wound of unspecified forearm, initial encounter: Secondary | ICD-10-CM | POA: Insufficient documentation

## 2014-01-09 DIAGNOSIS — Z9889 Other specified postprocedural states: Secondary | ICD-10-CM | POA: Insufficient documentation

## 2014-01-09 DIAGNOSIS — IMO0002 Reserved for concepts with insufficient information to code with codable children: Secondary | ICD-10-CM | POA: Insufficient documentation

## 2014-01-09 DIAGNOSIS — S1093XA Contusion of unspecified part of neck, initial encounter: Secondary | ICD-10-CM

## 2014-01-09 DIAGNOSIS — S41112A Laceration without foreign body of left upper arm, initial encounter: Secondary | ICD-10-CM

## 2014-01-09 DIAGNOSIS — S0083XA Contusion of other part of head, initial encounter: Secondary | ICD-10-CM

## 2014-01-09 DIAGNOSIS — S41109A Unspecified open wound of unspecified upper arm, initial encounter: Secondary | ICD-10-CM | POA: Insufficient documentation

## 2014-01-09 DIAGNOSIS — S0003XA Contusion of scalp, initial encounter: Secondary | ICD-10-CM | POA: Insufficient documentation

## 2014-01-09 DIAGNOSIS — R000 Tachycardia, unspecified: Secondary | ICD-10-CM | POA: Insufficient documentation

## 2014-01-09 DIAGNOSIS — Z87891 Personal history of nicotine dependence: Secondary | ICD-10-CM | POA: Insufficient documentation

## 2014-01-09 DIAGNOSIS — T07XXXA Unspecified multiple injuries, initial encounter: Secondary | ICD-10-CM

## 2014-01-09 DIAGNOSIS — S31109A Unspecified open wound of abdominal wall, unspecified quadrant without penetration into peritoneal cavity, initial encounter: Secondary | ICD-10-CM | POA: Insufficient documentation

## 2014-01-09 DIAGNOSIS — S31119A Laceration without foreign body of abdominal wall, unspecified quadrant without penetration into peritoneal cavity, initial encounter: Secondary | ICD-10-CM

## 2014-01-09 LAB — TYPE AND SCREEN
ABO/RH(D): A POS
Antibody Screen: NEGATIVE
UNIT DIVISION: 0
Unit division: 0

## 2014-01-09 LAB — CDS SEROLOGY

## 2014-01-09 LAB — COMPREHENSIVE METABOLIC PANEL
ALBUMIN: 3.6 g/dL (ref 3.5–5.2)
ALT: 16 U/L (ref 0–53)
AST: 18 U/L (ref 0–37)
Alkaline Phosphatase: 94 U/L (ref 39–117)
BUN: 13 mg/dL (ref 6–23)
CHLORIDE: 101 meq/L (ref 96–112)
CO2: 16 mEq/L — ABNORMAL LOW (ref 19–32)
Calcium: 8.7 mg/dL (ref 8.4–10.5)
Creatinine, Ser: 1.27 mg/dL (ref 0.50–1.35)
GFR calc Af Amer: 77 mL/min — ABNORMAL LOW (ref >90)
GFR calc non Af Amer: 66 mL/min — ABNORMAL LOW (ref >90)
Glucose, Bld: 136 mg/dL — ABNORMAL HIGH (ref 70–99)
Potassium: 3.8 mEq/L (ref 3.7–5.3)
Sodium: 143 mEq/L (ref 137–147)
TOTAL PROTEIN: 6.8 g/dL (ref 6.0–8.3)
Total Bilirubin: <0.2 mg/dL — ABNORMAL LOW (ref 0.3–1.2)

## 2014-01-09 LAB — CBC
HEMATOCRIT: 39.6 % (ref 39.0–52.0)
Hemoglobin: 13.7 g/dL (ref 13.0–17.0)
MCH: 32.5 pg (ref 26.0–34.0)
MCHC: 34.6 g/dL (ref 30.0–36.0)
MCV: 94.1 fL (ref 78.0–100.0)
Platelets: 217 10*3/uL (ref 150–400)
RBC: 4.21 MIL/uL — AB (ref 4.22–5.81)
RDW: 13.5 % (ref 11.5–15.5)
WBC: 7.5 10*3/uL (ref 4.0–10.5)

## 2014-01-09 LAB — ETHANOL: Alcohol, Ethyl (B): <11 mg/dL (ref 0–11)

## 2014-01-09 LAB — I-STAT CG4 LACTIC ACID, ED: LACTIC ACID, VENOUS: 1.39 mmol/L (ref 0.5–2.2)

## 2014-01-09 LAB — PROTIME-INR
INR: 1.13 (ref 0.00–1.49)
Prothrombin Time: 14.3 seconds (ref 11.6–15.2)

## 2014-01-09 LAB — ABO/RH: ABO/RH(D): A POS

## 2014-01-09 MED ORDER — MORPHINE SULFATE 4 MG/ML IJ SOLN
4.0000 mg | Freq: Once | INTRAMUSCULAR | Status: AC
  Administered 2014-01-09: 4 mg via INTRAVENOUS
  Filled 2014-01-09: qty 1

## 2014-01-09 MED ORDER — OXYCODONE-ACETAMINOPHEN 5-325 MG PO TABS: 2.0000 | ORAL_TABLET | ORAL | Status: DC | PRN

## 2014-01-09 MED ORDER — IOHEXOL 300 MG/ML  SOLN
100.0000 mL | Freq: Once | INTRAMUSCULAR | Status: AC | PRN
  Administered 2014-01-09: 100 mL via INTRAVENOUS

## 2014-01-09 MED ORDER — FENTANYL CITRATE 0.05 MG/ML IJ SOLN
50.0000 ug | Freq: Once | INTRAMUSCULAR | Status: AC
  Administered 2014-01-09: 50 ug via INTRAVENOUS

## 2014-01-09 MED ORDER — SODIUM CHLORIDE 0.9 % IV BOLUS (SEPSIS)
1000.0000 mL | Freq: Once | INTRAVENOUS | Status: AC
  Administered 2014-01-09: 1000 mL via INTRAVENOUS

## 2014-01-09 MED ORDER — FENTANYL CITRATE 0.05 MG/ML IJ SOLN
INTRAMUSCULAR | Status: AC
  Filled 2014-01-09: qty 2

## 2014-01-09 MED ORDER — SODIUM CHLORIDE 0.9 % IV SOLN
INTRAVENOUS | Status: AC | PRN
  Administered 2014-01-09: 1000 mL via INTRAVENOUS
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
## 2014-01-09 NOTE — ED Notes (Signed)
CG-4 result provided to Dr. Criss AlvineGoldston

## 2014-01-09 NOTE — Progress Notes (Signed)
Chaplain responded to level I page.  PT not available and no family present at this time.
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
## 2014-01-09 NOTE — ED Notes (Signed)
Ct

## 2014-01-09 NOTE — ED Notes (Signed)
Family at bedside. 
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
## 2014-01-10 ENCOUNTER — Encounter (HOSPITAL_COMMUNITY): Payer: Self-pay

## 2014-01-10 LAB — PREPARE FRESH FROZEN PLASMA
Unit division: 0
Unit division: 0

## 2014-01-10 LAB — BLOOD PRODUCT ORDER (VERBAL) VERIFICATION
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
## 2014-02-16 ENCOUNTER — Emergency Department (HOSPITAL_COMMUNITY): Payer: Self-pay

## 2014-02-16 ENCOUNTER — Ambulatory Visit (HOSPITAL_COMMUNITY): Payer: Self-pay | Admitting: Certified Registered"

## 2014-02-16 ENCOUNTER — Emergency Department (HOSPITAL_COMMUNITY): Payer: Self-pay | Admitting: Certified Registered"

## 2014-02-16 ENCOUNTER — Observation Stay (HOSPITAL_COMMUNITY)
Admission: EM | Admit: 2014-02-16 | Discharge: 2014-02-17 | Disposition: A | Discharge status: Home / Self Care | Payer: Self-pay | Attending: Otolaryngology | Admitting: Otolaryngology

## 2014-02-16 ENCOUNTER — Encounter (HOSPITAL_COMMUNITY): Payer: Self-pay | Admitting: Emergency Medicine

## 2014-02-16 ENCOUNTER — Encounter (HOSPITAL_COMMUNITY)
Admission: EM | Disposition: A | Discharge status: Home / Self Care | Payer: Self-pay | Source: Home / Self Care | Attending: Emergency Medicine

## 2014-02-16 DIAGNOSIS — I10 Essential (primary) hypertension: Secondary | ICD-10-CM

## 2014-02-16 DIAGNOSIS — S02609A Fracture of mandible, unspecified, initial encounter for closed fracture: Secondary | ICD-10-CM

## 2014-02-16 DIAGNOSIS — Z87891 Personal history of nicotine dependence: Secondary | ICD-10-CM | POA: Insufficient documentation

## 2014-02-16 DIAGNOSIS — S02640A Fracture of ramus of mandible, unspecified side, initial encounter for closed fracture: Principal | ICD-10-CM | POA: Insufficient documentation

## 2014-02-16 DIAGNOSIS — S02600B Fracture of unspecified part of body of mandible, initial encounter for open fracture: Secondary | ICD-10-CM | POA: Diagnosis present

## 2014-02-16 HISTORY — PX: EXTERNAL FIXATOR AND ARCH BAR REMOVAL: SHX5309

## 2014-02-16 HISTORY — PX: CLOSED REDUCTION MANDIBLE WITH MANDIBULOMA: SHX5313

## 2014-02-16 SURGERY — CLOSED REDUCTION, MANDIBLE, WITH ARCH BAR APPLICATION AND INTERMAXILLARY FIXATION
Anesthesia: General | Site: Mouth

## 2014-02-16 MED ORDER — PROMETHAZINE HCL 25 MG RE SUPP: 25.0000 mg | Freq: Four times a day (QID) | RECTAL | Status: DC | PRN

## 2014-02-16 MED ORDER — PROPOFOL 10 MG/ML IV BOLUS
INTRAVENOUS | Status: AC
  Filled 2014-02-16: qty 20

## 2014-02-16 MED ORDER — NEOSTIGMINE METHYLSULFATE 10 MG/10ML IV SOLN
INTRAVENOUS | Status: DC | PRN
  Administered 2014-02-16: 3 mg via INTRAVENOUS

## 2014-02-16 MED ORDER — OXYCODONE HCL 5 MG PO TABS: 5.0000 mg | ORAL_TABLET | Freq: Once | ORAL | Status: DC | PRN

## 2014-02-16 MED ORDER — PROPOFOL 10 MG/ML IV BOLUS
INTRAVENOUS | Status: DC | PRN
  Administered 2014-02-16: 50 mg via INTRAVENOUS
  Administered 2014-02-16: 200 mg via INTRAVENOUS

## 2014-02-16 MED ORDER — HYDROMORPHONE HCL PF 1 MG/ML IJ SOLN
0.5000 mg | Freq: Once | INTRAMUSCULAR | Status: AC
  Administered 2014-02-16: 0.5 mg via INTRAVENOUS
  Filled 2014-02-16: qty 1

## 2014-02-16 MED ORDER — LIDOCAINE HCL (CARDIAC) 20 MG/ML IV SOLN
INTRAVENOUS | Status: AC
  Filled 2014-02-16: qty 5

## 2014-02-16 MED ORDER — CLINDAMYCIN HCL 300 MG PO CAPS: 300.0000 mg | ORAL_CAPSULE | Freq: Three times a day (TID) | ORAL | Status: DC

## 2014-02-16 MED ORDER — DEXAMETHASONE SODIUM PHOSPHATE 4 MG/ML IJ SOLN
INTRAMUSCULAR | Status: DC | PRN
  Administered 2014-02-16: 4 mg via INTRAVENOUS

## 2014-02-16 MED ORDER — GLYCOPYRROLATE 0.2 MG/ML IJ SOLN
INTRAMUSCULAR | Status: DC | PRN
  Administered 2014-02-16: 0.2 mg via INTRAVENOUS
  Administered 2014-02-16: 0.4 mg via INTRAVENOUS

## 2014-02-16 MED ORDER — LACTATED RINGERS IV SOLN
INTRAVENOUS | Status: DC | PRN
  Administered 2014-02-16 (×2): via INTRAVENOUS

## 2014-02-16 MED ORDER — SUFENTANIL CITRATE 50 MCG/ML IV SOLN
INTRAVENOUS | Status: DC | PRN
  Administered 2014-02-16 (×3): 10 ug via INTRAVENOUS

## 2014-02-16 MED ORDER — ROCURONIUM BROMIDE 50 MG/5ML IV SOLN
INTRAVENOUS | Status: AC
  Filled 2014-02-16: qty 1

## 2014-02-16 MED ORDER — OXYCODONE HCL 5 MG/5ML PO SOLN: 5.0000 mg | Freq: Once | ORAL | Status: DC | PRN

## 2014-02-16 MED ORDER — GLYCOPYRROLATE 0.2 MG/ML IJ SOLN
INTRAMUSCULAR | Status: AC
  Filled 2014-02-16: qty 2

## 2014-02-16 MED ORDER — SUFENTANIL CITRATE 50 MCG/ML IV SOLN
INTRAVENOUS | Status: AC
  Filled 2014-02-16: qty 1

## 2014-02-16 MED ORDER — ROCURONIUM BROMIDE 100 MG/10ML IV SOLN
INTRAVENOUS | Status: DC | PRN
  Administered 2014-02-16: 30 mg via INTRAVENOUS

## 2014-02-16 MED ORDER — OXYMETAZOLINE HCL 0.05 % NA SOLN
NASAL | Status: DC | PRN
  Administered 2014-02-16 (×3): 2 via NASAL

## 2014-02-16 MED ORDER — HYDROMORPHONE HCL PF 1 MG/ML IJ SOLN
0.2500 mg | INTRAMUSCULAR | Status: DC | PRN
  Administered 2014-02-16 (×2): 0.5 mg via INTRAVENOUS

## 2014-02-16 MED ORDER — ONDANSETRON HCL 4 MG/2ML IJ SOLN
INTRAMUSCULAR | Status: AC
  Filled 2014-02-16: qty 2

## 2014-02-16 MED ORDER — MIDAZOLAM HCL 5 MG/5ML IJ SOLN
INTRAMUSCULAR | Status: DC | PRN
  Administered 2014-02-16: 2 mg via INTRAVENOUS

## 2014-02-16 MED ORDER — 0.9 % SODIUM CHLORIDE (POUR BTL) OPTIME
TOPICAL | Status: DC | PRN
  Administered 2014-02-16: 200 mL

## 2014-02-16 MED ORDER — LIDOCAINE HCL (CARDIAC) 20 MG/ML IV SOLN
INTRAVENOUS | Status: DC | PRN
  Administered 2014-02-16: 100 mg via INTRAVENOUS

## 2014-02-16 MED ORDER — SUCCINYLCHOLINE CHLORIDE 20 MG/ML IJ SOLN
INTRAMUSCULAR | Status: DC | PRN
  Administered 2014-02-16: 160 mg via INTRAVENOUS

## 2014-02-16 MED ORDER — HYDROCODONE-ACETAMINOPHEN 7.5-325 MG/15ML PO SOLN: 15.0000 mL | Freq: Four times a day (QID) | ORAL | Status: DC | PRN

## 2014-02-16 MED ORDER — OXYMETAZOLINE HCL 0.05 % NA SOLN
NASAL | Status: AC
  Filled 2014-02-16: qty 15

## 2014-02-16 MED ORDER — ONDANSETRON HCL 4 MG/2ML IJ SOLN: 4.0000 mg | Freq: Once | INTRAMUSCULAR | Status: DC | PRN

## 2014-02-16 MED ORDER — CLINDAMYCIN PHOSPHATE 600 MG/50ML IV SOLN
600.0000 mg | INTRAVENOUS | Status: AC
  Administered 2014-02-16: 600 mg via INTRAVENOUS
  Filled 2014-02-16: qty 50

## 2014-02-16 MED ORDER — SUCCINYLCHOLINE CHLORIDE 20 MG/ML IJ SOLN
INTRAMUSCULAR | Status: AC
  Filled 2014-02-16: qty 1

## 2014-02-16 MED ORDER — HYDRALAZINE HCL 20 MG/ML IJ SOLN
INTRAMUSCULAR | Status: AC
  Administered 2014-02-16: 10 mg
  Filled 2014-02-16: qty 1

## 2014-02-16 MED ORDER — MIDAZOLAM HCL 2 MG/2ML IJ SOLN
INTRAMUSCULAR | Status: AC
  Filled 2014-02-16: qty 2

## 2014-02-16 MED ORDER — NEOSTIGMINE METHYLSULFATE 10 MG/10ML IV SOLN
INTRAVENOUS | Status: AC
  Filled 2014-02-16: qty 1

## 2014-02-16 MED ORDER — HYDROMORPHONE HCL PF 1 MG/ML IJ SOLN
INTRAMUSCULAR | Status: AC
  Administered 2014-02-16: 0.5 mg via INTRAVENOUS
  Filled 2014-02-16: qty 1

## 2014-02-16 MED ORDER — ONDANSETRON HCL 4 MG/2ML IJ SOLN
INTRAMUSCULAR | Status: DC | PRN
  Administered 2014-02-16: 4 mg via INTRAVENOUS

## 2014-02-16 MED ORDER — DEXAMETHASONE SODIUM PHOSPHATE 4 MG/ML IJ SOLN
INTRAMUSCULAR | Status: AC
  Filled 2014-02-16: qty 1

## 2014-02-16 MED ORDER — SODIUM CHLORIDE 0.9 % IJ SOLN
INTRAMUSCULAR | Status: AC
  Filled 2014-02-16: qty 10

## 2014-02-16 SURGICAL SUPPLY — 33 items
CANISTER SUCTION 2500CC (MISCELLANEOUS) ×3 IMPLANT
CLEANER TIP ELECTROSURG 2X2 (MISCELLANEOUS) IMPLANT
COVER SURGICAL LIGHT HANDLE (MISCELLANEOUS) ×3 IMPLANT
ELECT COATED BLADE 2.86 ST (ELECTRODE) ×3 IMPLANT
ELECT NEEDLE TIP 2.8 STRL (NEEDLE) IMPLANT
ELECT REM PT RETURN 9FT ADLT (ELECTROSURGICAL) ×3
ELECTRODE REM PT RTRN 9FT ADLT (ELECTROSURGICAL) ×2 IMPLANT
GLOVE BIOGEL PI IND STRL 7.5 (GLOVE) ×2 IMPLANT
GLOVE BIOGEL PI INDICATOR 7.5 (GLOVE) ×1
GLOVE ECLIPSE 7.5 STRL STRAW (GLOVE) ×6 IMPLANT
GLOVE SS N UNI LF 7.5 STRL (GLOVE) ×3 IMPLANT
GOWN STRL REUS W/ TWL LRG LVL3 (GOWN DISPOSABLE) ×2 IMPLANT
GOWN STRL REUS W/TWL LRG LVL3 (GOWN DISPOSABLE) ×1
KIT BASIN OR (CUSTOM PROCEDURE TRAY) ×3 IMPLANT
KIT ROOM TURNOVER OR (KITS) ×3 IMPLANT
NEEDLE 27GAX1X1/2 (NEEDLE) ×3 IMPLANT
NS IRRIG 1000ML POUR BTL (IV SOLUTION) ×3 IMPLANT
PAD ARMBOARD 7.5X6 YLW CONV (MISCELLANEOUS) ×6 IMPLANT
PENCIL FOOT CONTROL (ELECTRODE) IMPLANT
PLATE HYBRID GOLD MMF (Plate) ×2 IMPLANT
PLATE HYBRID MMF GOLD (Plate) ×4 IMPLANT
SCISSORS WIRE DISP (INSTRUMENTS) ×3 IMPLANT
SCREW LOCK SELFDRIL 2.0X8M MMF (Screw) ×51 IMPLANT
SUT CHROMIC 3 0 PS 2 (SUTURE) ×3 IMPLANT
SUT STEEL 0 (SUTURE)
SUT STEEL 0 18XMFL TIE 17 (SUTURE) IMPLANT
SUT STEEL 2 (SUTURE) IMPLANT
SUT STEEL 4 (SUTURE) ×3 IMPLANT
SUT VICRYL 4-0 PS2 18IN ABS (SUTURE) ×3 IMPLANT
TOWEL OR 17X24 6PK STRL BLUE (TOWEL DISPOSABLE) ×3 IMPLANT
TOWEL OR 17X26 10 PK STRL BLUE (TOWEL DISPOSABLE) ×3 IMPLANT
TRAY ENT MC OR (CUSTOM PROCEDURE TRAY) ×3 IMPLANT
WATER STERILE IRR 1000ML POUR (IV SOLUTION) IMPLANT

## 2014-02-16 NOTE — ED Notes (Signed)
OR calling reporting we can bring patient up to OR and Dr. Jaci Lazierossen will see him there.

## 2014-02-16 NOTE — ED Notes (Signed)
Pt is sleeping
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
## 2014-02-16 NOTE — Anesthesia Procedure Notes (Signed)
Procedure Name: Intubation Date/Time: 02/16/2014 8:30 PM Performed by: Melina SchoolsBANKS, Jaeanna Mccomber J Pre-anesthesia Checklist: Patient identified, Emergency Drugs available, Suction available and Patient being monitored Patient Re-evaluated:Patient Re-evaluated prior to inductionOxygen Delivery Method: Circle system utilized Preoxygenation: Pre-oxygenation with 100% oxygen Intubation Type: IV induction, Rapid sequence and Cricoid Pressure applied Laryngoscope size: Glide Scope #3. Grade View: Grade I Nasal Tubes: Right, Nasal prep performed, Nasal Rae and Magill forceps - small, utilized Tube size: 7.0 mm Number of attempts: 1 Airway Equipment and Method: Video-laryngoscopy Placement Confirmation: ETT inserted through vocal cords under direct vision,  positive ETCO2 and breath sounds checked- equal and bilateral Tube secured with: Tape Dental Injury: Teeth and Oropharynx as per pre-operative assessment

## 2014-02-16 NOTE — Discharge Instructions (Signed)
Liquid diet only. Cut wires if you need to vomit and then contact Dr. Pollyann Kennedyosen.  Rinse with Peridex 4 times daily.  Brush teeth twice daily.

## 2014-02-16 NOTE — ED Notes (Signed)
Patient transported to X-ray 

## 2014-02-16 NOTE — Anesthesia Preprocedure Evaluation (Addendum)
Anesthesia Evaluation  Patient identified by MRN, date of birth, ID band Patient awake    Reviewed: Allergy & Precautions, H&P , NPO status , Patient's Chart, lab work & pertinent test results  History of Anesthesia Complications Negative for: history of anesthetic complications  Airway  TM Distance: >3 FB Neck ROM: Limited  Mouth opening: Limited Mouth Opening  Dental  (+) Poor Dentition, Dental Advisory Given   Pulmonary Current Smoker, former smoker,  Smokes 4-5 cigs/day breath sounds clear to auscultation        Cardiovascular negative cardio ROS  Rhythm:Regular Rate:Normal     Neuro/Psych negative neurological ROS  negative psych ROS   GI/Hepatic negative GI ROS, Neg liver ROS,   Endo/Other  negative endocrine ROS  Renal/GU negative Renal ROS  negative genitourinary   Musculoskeletal negative musculoskeletal ROS (+)   Abdominal   Peds  Hematology   Anesthesia Other Findings   Reproductive/Obstetrics                          Anesthesia Physical Anesthesia Plan  ASA: II and emergent  Anesthesia Plan: General   Post-op Pain Management:    Induction: Rapid sequence, Intravenous and Cricoid pressure planned  Airway Management Planned: Nasal ETT, Video Laryngoscope Planned and Simple Face Mask  Additional Equipment:   Intra-op Plan:   Post-operative Plan: Extubation in OR  Informed Consent: I have reviewed the patients History and Physical, chart, labs and discussed the procedure including the risks, benefits and alternatives for the proposed anesthesia with the patient or authorized representative who has indicated his/her understanding and acceptance.   Dental advisory given  Plan Discussed with: Anesthesiologist, Surgeon and CRNA  Anesthesia Plan Comments:        Anesthesia Quick Evaluation

## 2014-02-16 NOTE — Transfer of Care (Signed)
Immediate Anesthesia Transfer of Care Note  Patient: Brian Cain  Procedure(s) Performed: Procedure(s): CLOSED REDUCTION MANDIBLE WITH MANDIBULOMAXILLARY FUSION (N/A)  ARCH BARS PLACEMENT (Bilateral)  Patient Location: PACU  Anesthesia Type:General  Level of Consciousness: sedated, patient cooperative and responds to stimulation  Airway & Oxygen Therapy: Patient Spontanous Breathing and Patient connected to nasal cannula oxygen  Post-op Assessment: Report given to PACU RN, Post -op Vital signs reviewed and stable and Patient moving all extremities X 4  Post vital signs: Reviewed and stable  Complications: No apparent anesthesia complications
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
## 2014-02-16 NOTE — Anesthesia Postprocedure Evaluation (Signed)
  Anesthesia Post-op Note  Patient: Brian KocherRonald N Biancardi  Procedure(s) Performed: Procedure(s): CLOSED REDUCTION MANDIBLE WITH MANDIBULOMAXILLARY FUSION (N/A)  ARCH BARS PLACEMENT (Bilateral)  Patient Location: PACU  Anesthesia Type:General  Level of Consciousness: awake, alert  and oriented  Airway and Oxygen Therapy: Patient Spontanous Breathing and Patient connected to nasal cannula oxygen  Post-op Pain: mild  Post-op Assessment: Post-op Vital signs reviewed  Post-op Vital Signs: Reviewed  Last Vitals:  Filed Vitals:   02/16/14 2200  BP:   Pulse: 84  Temp:   Resp: 16    Complications: No apparent anesthesia complications

## 2014-02-16 NOTE — Op Note (Signed)
OPERATIVE REPORT  DATE OF SURGERY: 02/16/2014  PATIENT:  Lily Kocheronald N Moisan,  48 y.o. male  PRE-OPERATIVE DIAGNOSIS:  mandible fx  POST-OPERATIVE DIAGNOSIS:  same  PROCEDURE:  Procedure(s): CLOSED REDUCTION MANDIBLE WITH MANDIBULOMAXILLARY FUSION  ARCH BARS PLACEMENT  SURGEON:  Susy FrizzleJefry H Valoria Tamburri, MD  ASSISTANTS: none  ANESTHESIA:   General   EBL:  50 ml  DRAINS: none  LOCAL MEDICATIONS USED:  None  SPECIMEN:  none  COUNTS:  Correct  PROCEDURE DETAILS: The patient was taken to the operating room and placed on the operating table in the supine position. Following induction of general nasotracheal anesthesia, the face was draped in a standard fashion. A plastic cheek retractor was used throughout most of the case. Hybrid arch bars were placed upper and lower using 8 mm self-tapping, self-drilling screws. Using external forces, the right ramus fracture was reduced while the lower arch bar was completed. 22 G wire loops were then used to secure the interdental fixation, keeping in as close to his natural occlusion as possible, which seemed to include a very slight underbite. Good reduction and fixation with strong stability was achieved. The oral cavity was irrigated with saline and suctioned. The patient was awakened, extubated and transferred to PACU in stable condition.    PATIENT DISPOSITION:  To PACU, stable

## 2014-02-16 NOTE — ED Notes (Addendum)
EMT, Rocky LinkKen to take to OR. Pt made aware.

## 2014-02-17 ENCOUNTER — Encounter (HOSPITAL_COMMUNITY): Payer: Self-pay | Admitting: General Practice

## 2014-02-17 ENCOUNTER — Observation Stay (HOSPITAL_COMMUNITY): Payer: Self-pay

## 2014-02-17 MED ORDER — DEXTROSE-NACL 5-0.9 % IV SOLN
INTRAVENOUS | Status: DC
  Administered 2014-02-17: 04:00:00 via INTRAVENOUS

## 2014-02-17 MED ORDER — PROMETHAZINE HCL 25 MG PO TABS
25.0000 mg | ORAL_TABLET | Freq: Four times a day (QID) | ORAL | Status: DC | PRN
Start: 2014-02-17 — End: 2014-02-17

## 2014-02-17 MED ORDER — CLINDAMYCIN PHOSPHATE 600 MG/50ML IV SOLN
600.0000 mg | INTRAVENOUS | Status: DC
  Filled 2014-02-17: qty 50

## 2014-02-17 MED ORDER — CLINDAMYCIN PHOSPHATE 300 MG/50ML IV SOLN
300.0000 mg | Freq: Four times a day (QID) | INTRAVENOUS | Status: DC
  Administered 2014-02-17 (×2): 300 mg via INTRAVENOUS
  Filled 2014-02-17 (×5): qty 50

## 2014-02-17 MED ORDER — IBUPROFEN 100 MG/5ML PO SUSP
400.0000 mg | Freq: Four times a day (QID) | ORAL | Status: DC | PRN
  Filled 2014-02-17: qty 20

## 2014-02-17 MED ORDER — PROMETHAZINE HCL 25 MG RE SUPP: 25.0000 mg | Freq: Four times a day (QID) | RECTAL | Status: DC | PRN

## 2014-02-17 MED ORDER — HYDROCODONE-ACETAMINOPHEN 7.5-325 MG/15ML PO SOLN
10.0000 mL | ORAL | Status: DC | PRN
  Administered 2014-02-17 (×3): 15 mL via ORAL
  Filled 2014-02-17 (×3): qty 15

## 2014-02-17 MED ORDER — CHLORHEXIDINE GLUCONATE 0.12 % MT SOLN
15.0000 mL | Freq: Four times a day (QID) | OROMUCOSAL | Status: DC
  Administered 2014-02-17 (×2): 15 mL via OROMUCOSAL
  Filled 2014-02-17 (×2): qty 15

## 2014-02-17 MED ORDER — OXYMETAZOLINE HCL 0.05 % NA SOLN
2.0000 | NASAL | Status: DC
  Filled 2014-02-17: qty 15

## 2014-02-17 NOTE — ED Provider Notes (Signed)
Medical screening examination/treatment/procedure(s) were conducted as a shared visit with non-physician practitioner(s) and myself.  I personally evaluated the patient during the encounter.   EKG Interpretation None       Zarra Geffert, MD 02/17/14 1459 

## 2014-02-17 NOTE — Discharge Summary (Signed)
Physician Discharge Summary  Patient ID: Brian Cain MRN: [id number] DOB/AGE: 07/07/1966 48 y.o.  Admit date: 02/16/2014 Discharge date: 02/17/2014  Admission Diagnoses:Mandible fracture  Discharge Diagnoses:  Active Problems:   Open body of mandible fracture   Discharged Condition: good  Hospital Course: no complications  Consults: none  Significant Diagnostic Studies: none  Treatments: surgery: MMF  Discharge Exam: Blood pressure 156/74, pulse 84, temperature 97.3 F (36.3 C), temperature source Oral, resp. rate 20, height 5\' 11"  (1.803 m), weight 232 lb 14.4 oz (105.643 kg), SpO2 98.00%. PHYSICAL EXAM: MMF in place, stable  Disposition: 01-Home or Self Care  Discharge Orders   Future Orders Complete By Expires   Diet - low sodium heart healthy  As directed    Increase activity slowly  As directed        Medication List         clindamycin 300 MG capsule  Commonly known as:  CLEOCIN  Take 1 capsule (300 mg total) by mouth 3 (three) times daily.     HYDROcodone-acetaminophen 7.5-325 mg/15 ml solution  Commonly known as:  HYCET  Take 15 mLs by mouth 4 (four) times daily as needed for moderate pain.     promethazine 25 MG suppository  Commonly known as:  PHENERGAN  Place 1 suppository (25 mg total) rectally every 6 (six) hours as needed for nausea or vomiting.           Follow-up Information   Follow up with Serena ColonelOSEN, Newell Wafer, MD In 1 week.   Specialty:  Otolaryngology   Contact information:   [address] [place] [address] [phone]       Signed: Serena ColonelJefry Dung Cain 02/17/2014, 9:15 AM

## 2014-02-17 NOTE — Progress Notes (Signed)
Pt for discharge to home accomp by girlfriend.  Rx for phenergan, cleocin and hydroc. Elixir given and explained to pt.  Wire cutters sent home with pt.  Explained importance of taking the wire cutters with pt wherever he goes and how to use them if needed.  Pt informed on the importance of a liquid diet and to take in plenty of calories so he will not get dehydrated.  Pt is to call for FU appt in 1 week Dr. Pollyann Kennedyosen.  Toomey and red robinson catheters given to pt for eating liquid diet.

## 2014-02-19 ENCOUNTER — Encounter (HOSPITAL_COMMUNITY): Payer: Self-pay | Admitting: Otolaryngology

## 2014-04-04 ENCOUNTER — Other Ambulatory Visit (HOSPITAL_COMMUNITY): Payer: Self-pay | Admitting: Otolaryngology

## 2014-04-04 ENCOUNTER — Ambulatory Visit (HOSPITAL_COMMUNITY)
Admission: RE | Admit: 2014-04-04 | Discharge: 2014-04-04 | Disposition: A | Discharge status: Home / Self Care | Payer: Self-pay | Source: Ambulatory Visit | Attending: Otolaryngology | Admitting: Otolaryngology

## 2014-04-04 DIAGNOSIS — X58XXXA Exposure to other specified factors, initial encounter: Secondary | ICD-10-CM | POA: Insufficient documentation

## 2014-04-04 DIAGNOSIS — S02650A Fracture of angle of mandible, unspecified side, initial encounter for closed fracture: Secondary | ICD-10-CM | POA: Insufficient documentation

## 2014-04-04 DIAGNOSIS — IMO0001 Reserved for inherently not codable concepts without codable children: Secondary | ICD-10-CM

## 2014-04-04 DIAGNOSIS — S02600A Fracture of unspecified part of body of mandible, initial encounter for closed fracture: Secondary | ICD-10-CM | POA: Insufficient documentation

## 2014-04-06 ENCOUNTER — Encounter (HOSPITAL_BASED_OUTPATIENT_CLINIC_OR_DEPARTMENT_OTHER): Payer: Self-pay | Admitting: *Deleted

## 2014-04-06 NOTE — H&P (Signed)
Assessment  Open fracture of mandible, with routine healing, subsequent encounter (V54.19) (S02.609D). Orders  Panorex X-ray; Requested for: 03 Apr 2014. Reason For Visit  Follow up from mandible fracture. Discussed  Doing well, no new complaints. On the left stable. Occlusion looks good. Plan Panorex and MMF removal this week. Allergies  No Known Drug Allergies. Current Meds  No Reported Medications;; RPT. Active Problems  Arthritis   (716.90) (M19.90) Open fracture of mandible, with routine healing, subsequent encounter   (V54.19) (S02.609D). PSH  Closed Treatment Of Mandibular Fracture 09May2015 Hand Surgery. Signature  Electronically signed by : Serena ColonelJefry  Rosen  M.D.; 04/03/2014 2:32 PM EST.

## 2014-04-06 NOTE — Progress Notes (Signed)
No meds-no labs needed 

## 2014-04-09 ENCOUNTER — Encounter (HOSPITAL_BASED_OUTPATIENT_CLINIC_OR_DEPARTMENT_OTHER): Payer: Self-pay | Admitting: Anesthesiology

## 2014-04-09 ENCOUNTER — Ambulatory Visit (HOSPITAL_BASED_OUTPATIENT_CLINIC_OR_DEPARTMENT_OTHER): Payer: Self-pay | Admitting: Anesthesiology

## 2014-04-09 ENCOUNTER — Ambulatory Visit (HOSPITAL_BASED_OUTPATIENT_CLINIC_OR_DEPARTMENT_OTHER)
Admission: RE | Admit: 2014-04-09 | Discharge: 2014-04-09 | Disposition: A | Discharge status: Home / Self Care | Payer: Self-pay | Source: Ambulatory Visit | Attending: Otolaryngology | Admitting: Otolaryngology

## 2014-04-09 ENCOUNTER — Encounter (HOSPITAL_BASED_OUTPATIENT_CLINIC_OR_DEPARTMENT_OTHER)
Admission: RE | Disposition: A | Discharge status: Home / Self Care | Payer: Self-pay | Source: Ambulatory Visit | Attending: Otolaryngology

## 2014-04-09 DIAGNOSIS — S02600D Fracture of unspecified part of body of mandible, subsequent encounter for fracture with routine healing: Secondary | ICD-10-CM

## 2014-04-09 DIAGNOSIS — Z472 Encounter for removal of internal fixation device: Secondary | ICD-10-CM | POA: Insufficient documentation

## 2014-04-09 HISTORY — PX: MANDIBULAR HARDWARE REMOVAL: SHX5205

## 2014-04-09 LAB — POCT HEMOGLOBIN-HEMACUE: Hemoglobin: 14.2 g/dL (ref 13.0–17.0)

## 2014-04-09 SURGERY — REMOVAL, HARDWARE, MANDIBLE
Anesthesia: Monitor Anesthesia Care | Site: Mouth

## 2014-04-09 MED ORDER — OXYCODONE HCL 5 MG/5ML PO SOLN: 5.0000 mg | Freq: Once | ORAL | Status: DC | PRN

## 2014-04-09 MED ORDER — MIDAZOLAM HCL 2 MG/2ML IJ SOLN
INTRAMUSCULAR | Status: AC
  Filled 2014-04-09: qty 2

## 2014-04-09 MED ORDER — CLINDAMYCIN HCL 300 MG PO CAPS: 300.0000 mg | ORAL_CAPSULE | Freq: Three times a day (TID) | ORAL | Status: DC

## 2014-04-09 MED ORDER — HYDROCODONE-ACETAMINOPHEN 7.5-325 MG PO TABS: 1.0000 | ORAL_TABLET | Freq: Four times a day (QID) | ORAL | Status: DC | PRN

## 2014-04-09 MED ORDER — MIDAZOLAM HCL 2 MG/2ML IJ SOLN
1.0000 mg | INTRAMUSCULAR | Status: DC | PRN
  Administered 2014-04-09: 2 mg via INTRAVENOUS

## 2014-04-09 MED ORDER — LIDOCAINE HCL (CARDIAC) 20 MG/ML IV SOLN
INTRAVENOUS | Status: DC | PRN
  Administered 2014-04-09: 5 mg via INTRAVENOUS

## 2014-04-09 MED ORDER — CEFAZOLIN SODIUM-DEXTROSE 2-3 GM-% IV SOLR: 2.0000 g | INTRAVENOUS | Status: DC

## 2014-04-09 MED ORDER — CEFAZOLIN SODIUM-DEXTROSE 2-3 GM-% IV SOLR
INTRAVENOUS | Status: AC
  Filled 2014-04-09: qty 50

## 2014-04-09 MED ORDER — PROMETHAZINE HCL 25 MG/ML IJ SOLN: 6.2500 mg | INTRAMUSCULAR | Status: DC | PRN

## 2014-04-09 MED ORDER — FENTANYL CITRATE 0.05 MG/ML IJ SOLN
50.0000 ug | INTRAMUSCULAR | Status: DC | PRN
  Administered 2014-04-09: 100 ug via INTRAVENOUS

## 2014-04-09 MED ORDER — ONDANSETRON HCL 4 MG/2ML IJ SOLN
INTRAMUSCULAR | Status: DC | PRN
  Administered 2014-04-09: 4 mg via INTRAVENOUS

## 2014-04-09 MED ORDER — OXYCODONE HCL 5 MG PO TABS: 5.0000 mg | ORAL_TABLET | Freq: Once | ORAL | Status: DC | PRN

## 2014-04-09 MED ORDER — DEXAMETHASONE SODIUM PHOSPHATE 10 MG/ML IJ SOLN
INTRAMUSCULAR | Status: DC | PRN
  Administered 2014-04-09: 10 mg via INTRAVENOUS

## 2014-04-09 MED ORDER — LIDOCAINE-EPINEPHRINE 1 %-1:100000 IJ SOLN
INTRAMUSCULAR | Status: DC | PRN
  Administered 2014-04-09: 4 mL

## 2014-04-09 MED ORDER — FENTANYL CITRATE 0.05 MG/ML IJ SOLN
INTRAMUSCULAR | Status: AC
  Filled 2014-04-09: qty 6

## 2014-04-09 MED ORDER — LIDOCAINE-EPINEPHRINE 1 %-1:100000 IJ SOLN
INTRAMUSCULAR | Status: AC
  Filled 2014-04-09: qty 1

## 2014-04-09 MED ORDER — PROMETHAZINE HCL 25 MG RE SUPP
25.0000 mg | Freq: Four times a day (QID) | RECTAL | Status: DC | PRN
Start: 2014-04-09 — End: 2021-02-03

## 2014-04-09 MED ORDER — LACTATED RINGERS IV SOLN
INTRAVENOUS | Status: DC
Start: 2014-04-09 — End: 2014-04-09
  Administered 2014-04-09: 08:00:00 via INTRAVENOUS

## 2014-04-09 MED ORDER — PROPOFOL 10 MG/ML IV BOLUS
INTRAVENOUS | Status: DC | PRN
  Administered 2014-04-09 (×3): 50 mg via INTRAVENOUS

## 2014-04-09 MED ORDER — HYDROMORPHONE HCL PF 1 MG/ML IJ SOLN
INTRAMUSCULAR | Status: AC
  Filled 2014-04-09: qty 1

## 2014-04-09 MED ORDER — HYDROMORPHONE HCL PF 1 MG/ML IJ SOLN
0.2500 mg | INTRAMUSCULAR | Status: DC | PRN
  Administered 2014-04-09 (×2): 0.5 mg via INTRAVENOUS

## 2014-04-09 MED ORDER — MORPHINE SULFATE 2 MG/ML IJ SOLN: 1.0000 mg | INTRAMUSCULAR | Status: DC | PRN

## 2014-04-09 SURGICAL SUPPLY — 26 items
BLADE SURG 15 STRL LF DISP TIS (BLADE) ×1 IMPLANT
BLADE SURG 15 STRL SS (BLADE) ×1
CANISTER SUCT 1200ML W/VALVE (MISCELLANEOUS) ×2 IMPLANT
COVER MAYO STAND STRL (DRAPES) ×2 IMPLANT
DECANTER SPIKE VIAL GLASS SM (MISCELLANEOUS) ×2 IMPLANT
ELECT COATED BLADE 2.86 ST (ELECTRODE) IMPLANT
ELECT REM PT RETURN 9FT ADLT (ELECTROSURGICAL)
ELECTRODE REM PT RTRN 9FT ADLT (ELECTROSURGICAL) IMPLANT
GAUZE SPONGE 4X4 16PLY XRAY LF (GAUZE/BANDAGES/DRESSINGS) IMPLANT
GLOVE ECLIPSE 7.5 STRL STRAW (GLOVE) ×2 IMPLANT
GOWN STRL REUS W/ TWL LRG LVL3 (GOWN DISPOSABLE) ×1 IMPLANT
GOWN STRL REUS W/TWL LRG LVL3 (GOWN DISPOSABLE) ×1
MARKER SKIN DUAL TIP RULER LAB (MISCELLANEOUS) IMPLANT
NEEDLE 27GAX1X1/2 (NEEDLE) ×2 IMPLANT
NS IRRIG 1000ML POUR BTL (IV SOLUTION) IMPLANT
PACK BASIN DAY SURGERY FS (CUSTOM PROCEDURE TRAY) ×2 IMPLANT
PENCIL FOOT CONTROL (ELECTRODE) IMPLANT
SCISSORS WIRE ANG 4 3/4 DISP (INSTRUMENTS) IMPLANT
SHEET MEDIUM DRAPE 40X70 STRL (DRAPES) ×2 IMPLANT
SUT CHROMIC 3 0 PS 2 (SUTURE) IMPLANT
SUT CHROMIC 4 0 PS 2 18 (SUTURE) IMPLANT
SYR CONTROL 10ML LL (SYRINGE) ×2 IMPLANT
TOWEL OR 17X24 6PK STRL BLUE (TOWEL DISPOSABLE) ×4 IMPLANT
TRAY DSU PREP LF (CUSTOM PROCEDURE TRAY) IMPLANT
TUBE CONNECTING 20X1/4 (TUBING) ×2 IMPLANT
YANKAUER SUCT BULB TIP NO VENT (SUCTIONS) ×2 IMPLANT

## 2014-04-09 NOTE — Transfer of Care (Signed)
Immediate Anesthesia Transfer of Care Note  Patient: Brian KocherRonald N Danish  Procedure(s) Performed: Procedure(s): MANDIBULAR HARDWARE REMOVAL (N/A)  Patient Location: PACU  Anesthesia Type:MAC  Level of Consciousness: awake, alert  and oriented  Airway & Oxygen Therapy: Patient Spontanous Breathing and Patient connected to face mask oxygen  Post-op Assessment: Report given to PACU RN and Post -op Vital signs reviewed and stable  Post vital signs: Reviewed and stable  Complications: No apparent anesthesia complications

## 2014-04-09 NOTE — Interval H&P Note (Signed)
History and Physical Interval Note:  04/09/2014 8:40 AM  Brian Cain  has presented today for surgery, with the diagnosis of POST MANDIBULAR FRACTURE  The various methods of treatment have been discussed with the patient and family. After consideration of risks, benefits and other options for treatment, the patient has consented to  Procedure(s): MANDIBULAR HARDWARE REMOVAL (N/A) as a surgical intervention .  The patient's history has been reviewed, patient examined, no change in status, stable for surgery.  I have reviewed the patient's chart and labs.  Questions were answered to the patient's satisfaction.     ROSEN, JEFRY

## 2014-04-09 NOTE — Anesthesia Procedure Notes (Signed)
Procedure Name: MAC Date/Time: 04/09/2014 9:03 AM Performed by: Zenia ResidesPAYNE, LINDA D Pre-anesthesia Checklist: Patient identified, Emergency Drugs available, Suction available and Patient being monitored Patient Re-evaluated:Patient Re-evaluated prior to inductionOxygen Delivery Method: Circle System Utilized and Simple face mask Ventilation: Mask ventilation without difficulty Placement Confirmation: positive ETCO2 Tube secured with: Tape Dental Injury: Teeth and Oropharynx as per pre-operative assessment

## 2014-04-09 NOTE — Anesthesia Preprocedure Evaluation (Signed)
Anesthesia Evaluation  Patient identified by MRN, date of birth, ID band Patient awake    Reviewed: Allergy & Precautions, H&P , NPO status , Patient's Chart, lab work & pertinent test results  Airway       Dental   Pulmonary Current Smoker,  breath sounds clear to auscultation        Cardiovascular Rhythm:Regular Rate:Normal     Neuro/Psych    GI/Hepatic   Endo/Other    Renal/GU      Musculoskeletal   Abdominal   Peds  Hematology   Anesthesia Other Findings   Reproductive/Obstetrics                           Anesthesia Physical Anesthesia Plan  ASA: II  Anesthesia Plan: MAC   Post-op Pain Management:    Induction: Intravenous  Airway Management Planned: Mask  Additional Equipment:   Intra-op Plan:   Post-operative Plan:   Informed Consent: I have reviewed the patients History and Physical, chart, labs and discussed the procedure including the risks, benefits and alternatives for the proposed anesthesia with the patient or authorized representative who has indicated his/her understanding and acceptance.   Dental advisory given  Plan Discussed with: CRNA and Surgeon  Anesthesia Plan Comments:         Anesthesia Quick Evaluation

## 2014-04-09 NOTE — Discharge Instructions (Signed)
Stay on a soft diet. Rinse mouth with saltwater twice daily. Brush teeth twice daily.   Post Anesthesia Home Care Instructions  Activity: Get plenty of rest for the remainder of the day. A responsible adult should stay with you for 24 hours following the procedure.  For the next 24 hours, DO NOT: -Drive a car -Advertising copywriterperate machinery -Drink alcoholic beverages -Take any medication unless instructed by your physician -Make any legal decisions or sign important papers.  Meals: Start with liquid foods such as gelatin or soup. Progress to regular foods as tolerated. Avoid greasy, spicy, heavy foods. If nausea and/or vomiting occur, drink only clear liquids until the nausea and/or vomiting subsides. Call your physician if vomiting continues.  Special Instructions/Symptoms: Your throat may feel dry or sore from the anesthesia or the breathing tube placed in your throat during surgery. If this causes discomfort, gargle with warm salt water. The discomfort should disappear within 24 hours.

## 2014-04-09 NOTE — Op Note (Signed)
OPERATIVE REPORT  DATE OF SURGERY: 04/09/2014  PATIENT:  Brian Cain,  48 y.o. male  PRE-OPERATIVE DIAGNOSIS:  POST MANDIBULAR FRACTURE  POST-OPERATIVE DIAGNOSIS:  POST MANDIBULAR FRACTURE  PROCEDURE:  Procedure(s): MANDIBULAR HARDWARE REMOVAL  SURGEON:  Susy FrizzleJefry H Rosen, MD  ASSISTANTS: None  ANESTHESIA:   General   EBL:  30 ml  DRAINS: None   LOCAL MEDICATIONS USED:  1% Xylocaine with epinephrine  SPECIMEN:  none  COUNTS:  Correct  PROCEDURE DETAILS: The patient was taken to the operating room and placed on the operating table in the supine position. Following induction of intravenous sedation, the MMF wires were cut releasing the fixation. All fragments of the wire were removed. All of the mandibular and maxillary mucosa was infiltrated with local anesthetic solution and each of the screws were separately identified, uncovered from mucosa and removed. Both arch bars were removed. The oral cavity was irrigated with saline and suctioned. Patient was awakened from anesthesia and transferred to recovery in stable condition.    PATIENT DISPOSITION:  To PACU, stable

## 2014-04-09 NOTE — Anesthesia Postprocedure Evaluation (Signed)
  Anesthesia Post-op Note  Patient: Brian KocherRonald N Kite  Procedure(s) Performed: Procedure(s): MANDIBULAR HARDWARE REMOVAL (N/A)  Patient Location: PACU  Anesthesia Type:MAC  Level of Consciousness: awake and sedated  Airway and Oxygen Therapy: Patient Spontanous Breathing  Post-op Pain: mild  Post-op Assessment: Post-op Vital signs reviewed  Post-op Vital Signs: stable  Last Vitals:  Filed Vitals:   04/09/14 1030  BP: 147/88  Pulse: 52  Temp:   Resp: 12    Complications: No apparent anesthesia complications

## 2014-04-10 ENCOUNTER — Encounter (HOSPITAL_BASED_OUTPATIENT_CLINIC_OR_DEPARTMENT_OTHER): Payer: Self-pay | Admitting: Otolaryngology

## 2018-02-12 ENCOUNTER — Ambulatory Visit: Payer: Self-pay | Admitting: Family Medicine

## 2018-02-12 ENCOUNTER — Encounter: Payer: Self-pay | Admitting: Family Medicine

## 2018-02-12 ENCOUNTER — Other Ambulatory Visit: Payer: Self-pay

## 2018-02-12 VITALS — BP 132/86 | HR 77 | Temp 98.6°F | Resp 18 | Ht 69.49 in | Wt 259.2 lb

## 2018-02-12 DIAGNOSIS — Z024 Encounter for examination for driving license: Secondary | ICD-10-CM

## 2018-02-12 NOTE — Patient Instructions (Addendum)
  Continue to work on weight loss and follow up with primary care provider.    2 year card provided today.  Thank you for coming in.    IF you received an x-ray today, you will receive an invoice from [facility]. Please contact [facility] at [phone] with questions or concerns regarding your invoice.   IF you received labwork today, you will receive an invoice from [place]. Please contact [facility] at [phone] with questions or concerns regarding your invoice.   Our billing staff will not be able to assist you with questions regarding bills from these companies.  You will be contacted with the lab results as soon as they are available. The fastest way to get your results is to activate your My Chart account. Instructions are located on the last page of this paperwork. If you have not heard from [place] regarding the results in 2 weeks, please contact this office.

## 2018-02-12 NOTE — Progress Notes (Signed)
By signing my name below, I, Stann Ore, attest that this documentation has been prepared under the direction and in the presence of Meredith Staggers, MD. Electronically Signed: Stann Ore, Scribe. 02/12/2018 , 12:32 PM .  Patient was seen in Room 10 .  Patient ID: Brian Cain, male   DOB: [birth year], 52 y.o.   MRN: [id number] Chief Complaint  Patient presents with   DOT   Commercial Driver Medical Examination  Brian Cain is a 52 y.o. male who presents today for a commercial driver fitness determination physical exam. The patient reports no problems.   Review of Systems Pertinent items are noted in HPI.  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 He denies any change in his health since previous card. He denies snoring, daytime somnolence or history of sleep apnea. He denies taking regular medications. He denies chronic medical problems. 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 He denies history of heart problems, or weakness in extremities. He denies alcohol use. He denies history of smoking.   Vision: Visual Acuity in Right Eye - Without correction: 20/13-2   Visual Acuity in Left Eye - Without correction: 20/13-1   Visual Acuity in Both Eyes - Without correction: 20/13-1    Comments: Pt passed color test. Pt vision is 70 degrees.   Hearing Screening Comments: Pt passed whisper test.   Applicant can recognize and distinguish among traffic control signals and devices showing standard red, green, and amber colors.  Monocular Vision?: No   BP 132/86    Pulse 77    Temp 98.6 F (37 C) (Oral)    Resp 18    Ht 5' 9.49" (1.765 m)    Wt 259 lb 3.2 oz (117.6 kg)    SpO2 97%    BMI 37.74 kg/m   General Appearance:    Alert, cooperative, no distress, appears stated age; overweight/obese Head:    Normocephalic, without  obvious abnormality, atraumatic Eyes:    PERRL, conjunctiva/corneas clear, EOM's intact, fundi    benign, both eyes      Ears:    Normal TM's and external ear canals, both ears Nose:   Nares normal, septum midline, mucosa normal, no drainage    or sinus tenderness Throat:   Lips, mucosa, and tongue normal; teeth and gums normal Neck:   Supple, symmetrical, trachea midline, no adenopathy;       thyroid:  No enlargement/tenderness/nodules; no carotid   bruit or JVD Back:     Symmetric, no curvature, ROM normal, no CVA tenderness Lungs:     Clear to auscultation bilaterally, respirations unlabored Chest wall:    No tenderness or deformity Heart:    Regular rate and rhythm, S1 and S2 normal, no murmur, rub   or gallop Abdomen:     Soft, non-tender, bowel sounds active all four quadrants,    no masses, no organomegaly Genitalia:    Normal male without lesion, discharge or tenderness; no hernia Extremities:   Extremities normal, atraumatic, no cyanosis or edema Pulses:   2+ and symmetric all extremities Skin:   Skin color, texture, turgor normal, no rashes or lesions Lymph nodes:   Cervical, supraclavicular, and axillary nodes normal Neurologic:   CNII-XII intact. Normal strength, sensation   Assessment:  Healthy male exam.  Meets standards in 64 CFR 391.41;  qualifies for 2 year certificate.  Brian Cain is a 52 y.o. male Encounter for commercial driver medical examination (CDME)  -Overweight/obese, no concerns on exam/history otherwise. No snoring or daytime somnolence, no hx OSA.   -2 year card provided, recommended ongoing follow up with primary care provider.    No orders of the defined types were placed in this encounter.  Patient Instructions    Continue to work on weight loss and follow up with primary care provider.    2 year card provided today.  Thank you for coming in.    IF you received an x-ray today, you will receive an invoice from [facility].  Please contact [facility] at [phone] with questions or concerns regarding your invoice.   IF you received labwork today, you will receive an invoice from [place]. Please contact [facility] at [phone] with questions or concerns regarding your invoice.   Our billing staff will not be able to assist you with questions regarding bills from these companies.  You will be contacted with the lab results as soon as they are available. The fastest way to get your results is to activate your My Chart account. Instructions are located on the last page of this paperwork. If you have not heard from [place] regarding the results in 2 weeks, please contact this office.      I personally performed the services described in this documentation, which was scribed in my presence. The recorded information has been reviewed and considered for accuracy and completeness, addended by me as needed, and agree with information above.  Signed,   Meredith Staggers, MD Primary Care at [facility].  02/12/18 1:10 PM

## 2020-12-12 DIAGNOSIS — L732 Hidradenitis suppurativa: Secondary | ICD-10-CM | POA: Insufficient documentation

## 2021-02-03 ENCOUNTER — Encounter: Payer: Self-pay | Admitting: Physician Assistant

## 2021-02-03 ENCOUNTER — Ambulatory Visit (INDEPENDENT_AMBULATORY_CARE_PROVIDER_SITE_OTHER): Payer: No Typology Code available for payment source

## 2021-02-03 ENCOUNTER — Encounter (HOSPITAL_BASED_OUTPATIENT_CLINIC_OR_DEPARTMENT_OTHER): Payer: Self-pay | Admitting: Orthopaedic Surgery

## 2021-02-03 ENCOUNTER — Ambulatory Visit (INDEPENDENT_AMBULATORY_CARE_PROVIDER_SITE_OTHER): Payer: No Typology Code available for payment source | Admitting: Physician Assistant

## 2021-02-03 ENCOUNTER — Other Ambulatory Visit: Payer: Self-pay

## 2021-02-03 DIAGNOSIS — S86812A Strain of other muscle(s) and tendon(s) at lower leg level, left leg, initial encounter: Secondary | ICD-10-CM | POA: Diagnosis not present

## 2021-02-03 NOTE — Progress Notes (Signed)
Office Visit Note   Patient: Brian Cain           Date of Birth: 1965/12/13           MRN: [id number] Visit Date: 02/03/2021              Requested by: Fleet Contras, MD [address] [place],  [place] [address] PCP: Fleet Contras, MD   Assessment & Plan: Visit Diagnoses:  1. Patellar tendon rupture, left, initial encounter     Plan: He is weightbearing as tolerated in the knee immobilizer left lower extremity.  We will work on set him up for a left patella tendon repair in the near future.  Discussed with him the surgical procedure and risk benefits of surgery.  Also discussed postoperative protocol.  Questions were encouraged and answered by Dr. Magnus Ivan and myself.  Follow-Up Instructions: Return 2 weeks postop.   Orders:  Orders Placed This Encounter  Procedures  . XR Knee 1-2 Views Left   No orders of the defined types were placed in this encounter.     Procedures: No procedures performed   Clinical Data: No additional findings.   Subjective: Chief Complaint  Patient presents with  . Left Knee - Pain    HPI Mr. Brian Cain is a 55 year old male who had an injury to his left knee last Thursday.  He works as a Naval architect.  He went to open the door of the truck and the load had shifted and heavy pieces of box furniture began to fall out of the truck and injured his left knee.  He was seen in [place] at an outside hospital and was told to follow-up with orthopedics.  He was placed in a knee immobilizer.  Taking ibuprofen and Norco for the pain.  He sustained no other injury.  Patient is nondiabetic.  Overall states that he is healthy.  He has had no surgical interventions in the past.  Review of Systems See HPI  Objective: Vital Signs: There were no vitals taken for this visit.  Physical Exam Constitutional:      Appearance: He is not ill-appearing or diaphoretic.  Pulmonary:     Effort: Pulmonary effort is normal.  Neurological:     Mental  Status: He is alert and oriented to person, place, and time.  Psychiatric:        Mood and Affect: Mood normal.     Ortho Exam Left knee slight effusion no abnormal warmth or erythema.  Unable to do straight leg raise.  Nontender along the medial lateral joint line.  Palpable deficit of the patella tibial tendon.  Quad tendon without palpable deficit.  Left calf supple nontender. Specialty Comments:  No specialty comments available.  Imaging: XR Knee 1-2 Views Left  Result Date: 02/03/2021 Left knee 2 views: Medial lateral joint line well preserved.  Patella alto present.  Slight effusion.  No acute fractures.  Knee is well located.    PMFS History: Patient Active Problem List   Diagnosis Date Noted  . Hidradenitis suppurativa 12/12/2020  . Open body of mandible fracture (HCC) 02/16/2014  . Bee sting 06/08/2012  . Osteomyelitis of hand (HCC) 10/26/2011   History reviewed. No pertinent past medical history.  Family History  Problem Relation Age of Onset  . Cancer Mother        breast    Past Surgical History:  Procedure Laterality Date  . CLOSED REDUCTION MANDIBLE WITH MANDIBULOMA N/A 02/16/2014   Procedure: CLOSED REDUCTION MANDIBLE WITH  MANDIBULOMAXILLARY FUSION;  Surgeon: Serena Colonel, MD;  Location[facility] OR;  Service: ENT;  Laterality: N/A;  . EXTERNAL FIXATOR AND ARCH BAR REMOVAL Bilateral 02/16/2014   Procedure:  ARCH BARS PLACEMENT;  Surgeon: Serena Colonel, MD;  Location: [facility] OR;  Service: ENT;  Laterality: Bilateral;  . I & D EXTREMITY  08/22/2011   Procedure: IRRIGATION AND DEBRIDEMENT EXTREMITY;  Surgeon: Tami Ribas;  Location: [facility] OR;  Service: Orthopedics;  Laterality: Right;  . MANDIBULAR HARDWARE REMOVAL N/A 04/09/2014   Procedure: MANDIBULAR HARDWARE REMOVAL;  Surgeon: Serena Colonel, MD;  Location: [facility];  Service: ENT;  Laterality: N/A;  . WRIST SURGERY     Social History   Occupational History  . Not on file  Tobacco Use  . Smoking status:  Former Smoker    Packs/day: 0.30    Years: 3.00    Pack years: 0.90    Types: Cigarettes    Quit date: 02/24/2012    Years since quitting: 8.9  . Smokeless tobacco: Never Used  Substance and Sexual Activity  . Alcohol use: Yes    Comment: occ  . Drug use: Yes    Types: Marijuana  . Sexual activity: Yes    Birth control/protection: Condom    Comment: 5 daily

## 2021-02-04 ENCOUNTER — Other Ambulatory Visit (HOSPITAL_BASED_OUTPATIENT_CLINIC_OR_DEPARTMENT_OTHER): Payer: No Typology Code available for payment source

## 2021-02-04 ENCOUNTER — Other Ambulatory Visit: Payer: Self-pay | Admitting: Physician Assistant

## 2021-02-04 ENCOUNTER — Other Ambulatory Visit (HOSPITAL_COMMUNITY)
Admission: RE | Admit: 2021-02-04 | Discharge: 2021-02-04 | Disposition: A | Discharge status: Home / Self Care | Payer: No Typology Code available for payment source | Source: Ambulatory Visit | Attending: Orthopaedic Surgery | Admitting: Orthopaedic Surgery

## 2021-02-04 DIAGNOSIS — Z01812 Encounter for preprocedural laboratory examination: Secondary | ICD-10-CM | POA: Diagnosis not present

## 2021-02-04 DIAGNOSIS — Z20822 Contact with and (suspected) exposure to covid-19: Secondary | ICD-10-CM | POA: Diagnosis not present

## 2021-02-05 ENCOUNTER — Other Ambulatory Visit: Payer: Self-pay

## 2021-02-05 DIAGNOSIS — S86812D Strain of other muscle(s) and tendon(s) at lower leg level, left leg, subsequent encounter: Secondary | ICD-10-CM

## 2021-02-05 LAB — SARS CORONAVIRUS 2 (TAT 6-24 HRS): SARS Coronavirus 2: NEGATIVE

## 2021-02-05 NOTE — H&P (Signed)
Brian Cain is an 55 y.o. male.   Chief Complaint:  Left knee pain/swelling; known patella tendon rupture HPI: The patient is a 55 year old gentleman who injured his knee last Thursday in a work-related accident when he was trying to avoid boxes falling out from a truck that had been loaded with boxes and he was checking to see if the boxes it shifted.  They started to follow the truck awkwardly and trying to stop this, he injured his left knee.  He was seen in outlying hospital and then told to seek orthopedic follow-up.  He is in a knee immobilizer.  When we saw him in the office this week we recognize that he had a complete rupture of his left patella tendon with a knee joint effusion.  Given the acute nature of this injury, surgery has been recommended.  History reviewed. No pertinent past medical history.  Past Surgical History:  Procedure Laterality Date  . CLOSED REDUCTION MANDIBLE WITH MANDIBULOMA N/A 02/16/2014   Procedure: CLOSED REDUCTION MANDIBLE WITH MANDIBULOMAXILLARY FUSION;  Surgeon: Serena Colonel, MD;  Location: [facility] OR;  Service: ENT;  Laterality: N/A;  . EXTERNAL FIXATOR AND ARCH BAR REMOVAL Bilateral 02/16/2014   Procedure:  ARCH BARS PLACEMENT;  Surgeon: Serena Colonel, MD;  Location: [facility] OR;  Service: ENT;  Laterality: Bilateral;  . I & D EXTREMITY  08/22/2011   Procedure: IRRIGATION AND DEBRIDEMENT EXTREMITY;  Surgeon: Tami Ribas;  Location: [facility] OR;  Service: Orthopedics;  Laterality: Right;  . MANDIBULAR HARDWARE REMOVAL N/A 04/09/2014   Procedure: MANDIBULAR HARDWARE REMOVAL;  Surgeon: Serena Colonel, MD;  Location: [facility];  Service: ENT;  Laterality: N/A;  . WRIST SURGERY      Family History  Problem Relation Age of Onset  . Cancer Mother        breast   Social History:  reports that he quit smoking about 8 years ago. His smoking use included cigarettes. He has a 0.90 pack-year smoking history. He has never used smokeless tobacco. He reports current alcohol  use. He reports current drug use. Drug: Marijuana.  Allergies:  Allergies  Allergen Reactions  . Bee Venom Swelling    No medications prior to admission.    Results for orders placed or performed during the hospital encounter of 02/04/21 (from the past 48 hour(s))  SARS CORONAVIRUS 2 (TAT 6-24 HRS) Nasopharyngeal Nasopharyngeal Swab     Status: None   Collection Time: 02/04/21  1:50 PM   Specimen: Nasopharyngeal Swab  Result Value Ref Range   SARS Coronavirus 2 NEGATIVE NEGATIVE    Comment: (NOTE) SARS-CoV-2 target nucleic acids are NOT DETECTED.  The SARS-CoV-2 RNA is generally detectable in upper and lower respiratory specimens during the acute phase of infection. Negative results do not preclude SARS-CoV-2 infection, do not rule out co-infections with other pathogens, and should not be used as the sole basis for treatment or other patient management decisions. Negative results must be combined with clinical observations, patient history, and epidemiological information. The expected result is Negative.  Fact Sheet for Patients: HairSlick.no  Fact Sheet for Healthcare Providers: quierodirigir.com  This test is not yet approved or cleared by the [place] FDA and  has been authorized for detection and/or diagnosis of SARS-CoV-2 by FDA under an Emergency Use Authorization (EUA). This EUA will remain  in effect (meaning this test can be used) for the duration of the COVID-19 declaration under Se ction 564(b)(1) of the Act, 21 U.S.C. section 360bbb-3(b)(1), unless the  authorization is terminated or revoked sooner.  Performed at [facility], [address], [place], [place] [address]    No results found.  Review of Systems  All other systems reviewed and are negative.   Height 5\' 9"  (1.753 m), weight 115.7 kg. Physical Exam Vitals reviewed.  Constitutional:      Appearance: Normal appearance.  HENT:      Head: Normocephalic and atraumatic.  Eyes:     Extraocular Movements: Extraocular movements intact.     Pupils: Pupils are equal, round, and reactive to light.  Cardiovascular:     Rate and Rhythm: Normal rate.     Pulses: Normal pulses.  Pulmonary:     Effort: Pulmonary effort is normal.  Abdominal:     Palpations: Abdomen is soft.  Musculoskeletal:     Cervical back: Normal range of motion.     Left knee: Deformity and effusion present. Decreased range of motion. Abnormal patellar mobility.  Neurological:     Mental Status: He is alert and oriented to person, place, and time.  Psychiatric:        Behavior: Behavior normal.   There is a deficit when palpating just below the patella on the left knee.  The patient has inability to extend his left knee.  There is a large left knee joint effusion.  Assessment/Plan Acute rupture of the left knee patella tendon  The plan is to proceed to surgery for direct primary repair of the left knee ruptured patella tendon.  The surgery has been described to the patient and a discussion of the risk and benefits of surgery has been had.  , MD 02/05/2021, 7:52 PM

## 2021-02-06 ENCOUNTER — Ambulatory Visit (HOSPITAL_BASED_OUTPATIENT_CLINIC_OR_DEPARTMENT_OTHER): Payer: No Typology Code available for payment source | Admitting: Anesthesiology

## 2021-02-06 ENCOUNTER — Other Ambulatory Visit: Payer: Self-pay

## 2021-02-06 ENCOUNTER — Ambulatory Visit (HOSPITAL_BASED_OUTPATIENT_CLINIC_OR_DEPARTMENT_OTHER)
Admission: RE | Admit: 2021-02-06 | Discharge: 2021-02-06 | Disposition: A | Discharge status: Home / Self Care | Payer: No Typology Code available for payment source | Attending: Orthopaedic Surgery | Admitting: Orthopaedic Surgery

## 2021-02-06 ENCOUNTER — Encounter (HOSPITAL_BASED_OUTPATIENT_CLINIC_OR_DEPARTMENT_OTHER): Payer: Self-pay | Admitting: Orthopaedic Surgery

## 2021-02-06 ENCOUNTER — Encounter (HOSPITAL_BASED_OUTPATIENT_CLINIC_OR_DEPARTMENT_OTHER)
Admission: RE | Disposition: A | Discharge status: Home / Self Care | Payer: Self-pay | Source: Home / Self Care | Attending: Orthopaedic Surgery

## 2021-02-06 DIAGNOSIS — S76112A Strain of left quadriceps muscle, fascia and tendon, initial encounter: Secondary | ICD-10-CM | POA: Diagnosis not present

## 2021-02-06 DIAGNOSIS — Z87891 Personal history of nicotine dependence: Secondary | ICD-10-CM | POA: Insufficient documentation

## 2021-02-06 DIAGNOSIS — X509XXA Other and unspecified overexertion or strenuous movements or postures, initial encounter: Secondary | ICD-10-CM | POA: Insufficient documentation

## 2021-02-06 DIAGNOSIS — S86812D Strain of other muscle(s) and tendon(s) at lower leg level, left leg, subsequent encounter: Secondary | ICD-10-CM

## 2021-02-06 DIAGNOSIS — Y990 Civilian activity done for income or pay: Secondary | ICD-10-CM | POA: Diagnosis not present

## 2021-02-06 DIAGNOSIS — Z91030 Bee allergy status: Secondary | ICD-10-CM | POA: Diagnosis not present

## 2021-02-06 HISTORY — PX: PATELLAR TENDON REPAIR: SHX737

## 2021-02-06 SURGERY — REPAIR, TENDON, PATELLAR
Anesthesia: General | Site: Knee | Laterality: Left

## 2021-02-06 MED ORDER — HYDROMORPHONE HCL 1 MG/ML IJ SOLN
INTRAMUSCULAR | Status: AC
  Filled 2021-02-06: qty 0.5

## 2021-02-06 MED ORDER — FENTANYL CITRATE (PF) 100 MCG/2ML IJ SOLN
INTRAMUSCULAR | Status: AC
  Filled 2021-02-06: qty 2

## 2021-02-06 MED ORDER — MIDAZOLAM HCL 2 MG/2ML IJ SOLN
2.0000 mg | Freq: Once | INTRAMUSCULAR | Status: AC
  Administered 2021-02-06: 2 mg via INTRAVENOUS

## 2021-02-06 MED ORDER — AMISULPRIDE (ANTIEMETIC) 5 MG/2ML IV SOLN: 10.0000 mg | Freq: Once | INTRAVENOUS | Status: DC | PRN

## 2021-02-06 MED ORDER — PROPOFOL 10 MG/ML IV BOLUS
INTRAVENOUS | Status: DC | PRN
  Administered 2021-02-06: 200 mg via INTRAVENOUS
  Administered 2021-02-06: 50 mg via INTRAVENOUS

## 2021-02-06 MED ORDER — OXYCODONE HCL 5 MG/5ML PO SOLN: 5.0000 mg | Freq: Once | ORAL | Status: AC | PRN

## 2021-02-06 MED ORDER — LACTATED RINGERS IV SOLN: INTRAVENOUS | Status: DC

## 2021-02-06 MED ORDER — PROMETHAZINE HCL 25 MG/ML IJ SOLN: 6.2500 mg | INTRAMUSCULAR | Status: DC | PRN

## 2021-02-06 MED ORDER — OXYCODONE HCL 5 MG PO TABS
ORAL_TABLET | ORAL | Status: AC
  Filled 2021-02-06: qty 1

## 2021-02-06 MED ORDER — FENTANYL CITRATE (PF) 100 MCG/2ML IJ SOLN
100.0000 ug | Freq: Once | INTRAMUSCULAR | Status: AC
  Administered 2021-02-06: 100 ug via INTRAVENOUS

## 2021-02-06 MED ORDER — DEXAMETHASONE SODIUM PHOSPHATE 10 MG/ML IJ SOLN
INTRAMUSCULAR | Status: DC | PRN
  Administered 2021-02-06: 5 mg via INTRAVENOUS

## 2021-02-06 MED ORDER — MEPERIDINE HCL 25 MG/ML IJ SOLN: 6.2500 mg | INTRAMUSCULAR | Status: DC | PRN

## 2021-02-06 MED ORDER — ONDANSETRON HCL 4 MG/2ML IJ SOLN
INTRAMUSCULAR | Status: AC
  Filled 2021-02-06: qty 2

## 2021-02-06 MED ORDER — OXYCODONE HCL 5 MG PO TABS
5.0000 mg | ORAL_TABLET | Freq: Once | ORAL | Status: AC | PRN
Start: 2021-02-06 — End: 2021-02-06
  Administered 2021-02-06: 5 mg via ORAL

## 2021-02-06 MED ORDER — BUPIVACAINE HCL (PF) 0.25 % IJ SOLN
INTRAMUSCULAR | Status: AC
  Filled 2021-02-06: qty 30

## 2021-02-06 MED ORDER — ACETAMINOPHEN 10 MG/ML IV SOLN
INTRAVENOUS | Status: AC
  Filled 2021-02-06: qty 100

## 2021-02-06 MED ORDER — MIDAZOLAM HCL 2 MG/2ML IJ SOLN
INTRAMUSCULAR | Status: AC
  Filled 2021-02-06: qty 2

## 2021-02-06 MED ORDER — ONDANSETRON HCL 4 MG/2ML IJ SOLN
INTRAMUSCULAR | Status: DC | PRN
  Administered 2021-02-06: 4 mg via INTRAVENOUS

## 2021-02-06 MED ORDER — MIDAZOLAM HCL 5 MG/5ML IJ SOLN
INTRAMUSCULAR | Status: DC | PRN
  Administered 2021-02-06: 1 mg via INTRAVENOUS

## 2021-02-06 MED ORDER — HYDROMORPHONE HCL 1 MG/ML IJ SOLN
0.2500 mg | INTRAMUSCULAR | Status: DC | PRN
  Administered 2021-02-06 (×3): 0.5 mg via INTRAVENOUS

## 2021-02-06 MED ORDER — CEFAZOLIN SODIUM-DEXTROSE 2-4 GM/100ML-% IV SOLN
INTRAVENOUS | Status: AC
  Filled 2021-02-06: qty 100

## 2021-02-06 MED ORDER — OXYCODONE HCL 5 MG PO TABS: 5.0000 mg | ORAL_TABLET | Freq: Four times a day (QID) | ORAL | 0 refills | Status: DC | PRN

## 2021-02-06 MED ORDER — ACETAMINOPHEN 10 MG/ML IV SOLN
1000.0000 mg | Freq: Once | INTRAVENOUS | Status: AC
  Administered 2021-02-06: 1000 mg via INTRAVENOUS

## 2021-02-06 MED ORDER — PROPOFOL 10 MG/ML IV BOLUS
INTRAVENOUS | Status: AC
  Filled 2021-02-06: qty 20

## 2021-02-06 MED ORDER — LIDOCAINE 2% (20 MG/ML) 5 ML SYRINGE
INTRAMUSCULAR | Status: AC
  Filled 2021-02-06: qty 5

## 2021-02-06 MED ORDER — ROPIVACAINE HCL 5 MG/ML IJ SOLN
INTRAMUSCULAR | Status: DC | PRN
  Administered 2021-02-06: 30 mL via PERINEURAL

## 2021-02-06 MED ORDER — LIDOCAINE HCL (CARDIAC) PF 100 MG/5ML IV SOSY
PREFILLED_SYRINGE | INTRAVENOUS | Status: DC | PRN
  Administered 2021-02-06: 100 mg via INTRATRACHEAL

## 2021-02-06 MED ORDER — FENTANYL CITRATE (PF) 100 MCG/2ML IJ SOLN
INTRAMUSCULAR | Status: DC | PRN
  Administered 2021-02-06 (×4): 50 ug via INTRAVENOUS

## 2021-02-06 MED ORDER — CEFAZOLIN SODIUM-DEXTROSE 2-4 GM/100ML-% IV SOLN
2.0000 g | INTRAVENOUS | Status: AC
  Administered 2021-02-06: 2 g via INTRAVENOUS

## 2021-02-06 SURGICAL SUPPLY — 71 items
BANDAGE ESMARK 6X9 LF (GAUZE/BANDAGES/DRESSINGS) ×1 IMPLANT
BIT DRILL CANN 3.5X160 QC (BIT) ×2 IMPLANT
BLADE SURG 10 STRL SS (BLADE) ×2 IMPLANT
BLADE SURG 15 STRL LF DISP TIS (BLADE) ×2 IMPLANT
BLADE SURG 15 STRL SS (BLADE) ×4
BNDG CMPR 9X6 STRL LF SNTH (GAUZE/BANDAGES/DRESSINGS) ×1
BNDG COHESIVE 4X5 TAN STRL (GAUZE/BANDAGES/DRESSINGS) ×2 IMPLANT
BNDG CONFORM 4 STRL LF (GAUZE/BANDAGES/DRESSINGS) IMPLANT
BNDG ELASTIC 6X5.8 VLCR STR LF (GAUZE/BANDAGES/DRESSINGS) ×2 IMPLANT
BNDG ESMARK 6X9 LF (GAUZE/BANDAGES/DRESSINGS) ×2
BNDG GAUZE ELAST 4 BULKY (GAUZE/BANDAGES/DRESSINGS) ×2 IMPLANT
CANISTER SUCT 1200ML W/VALVE (MISCELLANEOUS) ×2 IMPLANT
COVER BACK TABLE 60X90IN (DRAPES) ×2 IMPLANT
COVER WAND RF STERILE (DRAPES) IMPLANT
CUFF TOURN SGL QUICK 34 (TOURNIQUET CUFF) ×2
CUFF TRNQT CYL 34X4X40X1 (TOURNIQUET CUFF) ×1 IMPLANT
DRAPE EXTREMITY T 121X128X90 (DISPOSABLE) ×2 IMPLANT
DRAPE U-SHAPE 47X51 STRL (DRAPES) ×2 IMPLANT
DRSG EMULSION OIL 3X3 NADH (GAUZE/BANDAGES/DRESSINGS) IMPLANT
DRSG PAD ABDOMINAL 8X10 ST (GAUZE/BANDAGES/DRESSINGS) ×4 IMPLANT
DURAPREP 26ML APPLICATOR (WOUND CARE) ×2 IMPLANT
ELECT REM PT RETURN 9FT ADLT (ELECTROSURGICAL) ×2
ELECTRODE REM PT RTRN 9FT ADLT (ELECTROSURGICAL) ×1 IMPLANT
GAUZE 4X4 16PLY RFD (DISPOSABLE) IMPLANT
GAUZE XEROFORM 5X9 LF (GAUZE/BANDAGES/DRESSINGS) ×2 IMPLANT
GLOVE SRG 8 PF TXTR STRL LF DI (GLOVE) ×1 IMPLANT
GLOVE SURG ENC MOIS LTX SZ7.5 (GLOVE) ×2 IMPLANT
GLOVE SURG POLYISO LF SZ6.5 (GLOVE) ×2 IMPLANT
GLOVE SURG UNDER POLY LF SZ6.5 (GLOVE) ×2 IMPLANT
GLOVE SURG UNDER POLY LF SZ7 (GLOVE) ×2 IMPLANT
GLOVE SURG UNDER POLY LF SZ8 (GLOVE) ×2
GOWN STRL REUS W/ TWL LRG LVL3 (GOWN DISPOSABLE) ×1 IMPLANT
GOWN STRL REUS W/ TWL XL LVL3 (GOWN DISPOSABLE) ×2 IMPLANT
GOWN STRL REUS W/TWL LRG LVL3 (GOWN DISPOSABLE) ×2
GOWN STRL REUS W/TWL XL LVL3 (GOWN DISPOSABLE) ×4
GUIDEWIRE THREADED 150MM (WIRE) ×6 IMPLANT
IMMOBILIZER KNEE 24 THIGH 36 (MISCELLANEOUS) ×1 IMPLANT
IMMOBILIZER KNEE 24 UNIV (MISCELLANEOUS) ×2
NEEDLE HYPO 22GX1.5 SAFETY (NEEDLE) IMPLANT
NS IRRIG 1000ML POUR BTL (IV SOLUTION) ×2 IMPLANT
PACK BASIN DAY SURGERY FS (CUSTOM PROCEDURE TRAY) ×2 IMPLANT
PAD CAST 4YDX4 CTTN HI CHSV (CAST SUPPLIES) ×2 IMPLANT
PADDING CAST ABS 4INX4YD NS (CAST SUPPLIES) ×1
PADDING CAST ABS COTTON 4X4 ST (CAST SUPPLIES) ×1 IMPLANT
PADDING CAST COTTON 4X4 STRL (CAST SUPPLIES) ×4
PENCIL SMOKE EVACUATOR (MISCELLANEOUS) ×2 IMPLANT
RETRIEVER SUT HEWSON (MISCELLANEOUS) ×2 IMPLANT
SHEET MEDIUM DRAPE 40X70 STRL (DRAPES) ×4 IMPLANT
SPONGE LAP 4X18 RFD (DISPOSABLE) ×2 IMPLANT
STAPLER VISISTAT 35W (STAPLE) ×2 IMPLANT
STOCKINETTE IMPERVIOUS LG (DRAPES) ×2 IMPLANT
SUCTION FRAZIER HANDLE 10FR (MISCELLANEOUS) ×2
SUCTION TUBE FRAZIER 10FR DISP (MISCELLANEOUS) ×1 IMPLANT
SUT ETHIBOND NAB CT1 #1 30IN (SUTURE) ×6 IMPLANT
SUT ETHILON 3 0 PS 1 (SUTURE) IMPLANT
SUT ETHILON 4 0 PS 2 18 (SUTURE) IMPLANT
SUT FIBERWIRE #2 38 REV NDL BL (SUTURE) ×4
SUT FIBERWIRE #2 38 T-5 BLUE (SUTURE)
SUT VIC AB 0 CT1 27 (SUTURE) ×4
SUT VIC AB 0 CT1 27XBRD ANBCTR (SUTURE) ×2 IMPLANT
SUT VIC AB 2-0 SH 27 (SUTURE) ×4
SUT VIC AB 2-0 SH 27XBRD (SUTURE) ×2 IMPLANT
SUT VIC AB 3-0 FS2 27 (SUTURE) IMPLANT
SUT VICRYL 4-0 PS2 18IN ABS (SUTURE) IMPLANT
SUTURE FIBERWR #2 38 T-5 BLUE (SUTURE) IMPLANT
SUTURE FIBERWR#2 38 REV NDL BL (SUTURE) ×2 IMPLANT
SYR BULB EAR ULCER 3OZ GRN STR (SYRINGE) ×2 IMPLANT
SYR CONTROL 10ML LL (SYRINGE) IMPLANT
TUBE CONNECTING 20X1/4 (TUBING) ×2 IMPLANT
UNDERPAD 30X36 HEAVY ABSORB (UNDERPADS AND DIAPERS) ×2 IMPLANT
YANKAUER SUCT BULB TIP NO VENT (SUCTIONS) ×2 IMPLANT

## 2021-02-06 NOTE — Anesthesia Postprocedure Evaluation (Signed)
Anesthesia Post Note  Patient: Brian Cain  Procedure(s) Performed: LEFT PATELLA TENDON PRIMARY REPAIR (Left Knee)     Patient location during evaluation: PACU Anesthesia Type: General Level of consciousness: awake and alert Pain management: pain level controlled Vital Signs Assessment: post-procedure vital signs reviewed and stable Respiratory status: spontaneous breathing, nonlabored ventilation and respiratory function stable Cardiovascular status: blood pressure returned to baseline and stable Postop Assessment: no apparent nausea or vomiting Anesthetic complications: no   No complications documented.  Last Vitals:  Vitals:   02/06/21 1300 02/06/21 1315  BP: (!) 175/96 (!) 178/94  Pulse: 68 69  Resp: (!) 26 16  Temp:    SpO2: 98% 100%    Last Pain:  Vitals:   02/06/21 1315  TempSrc:   PainSc: Asleep                 Lowella Curb

## 2021-02-06 NOTE — Interval H&P Note (Signed)
History and Physical Interval Note: The patient understands fully that he is here today for a direct primary repair of his ruptured left patellar tendon.  There is been no acute change or interval change in his medical status.  See recent H&P.  The risks and benefits of surgery have been explained in detail and informed consent is obtained.  The left knee has been marked.  02/06/2021 10:38 AM  Lily Kocher  has presented today for surgery, with the diagnosis of LEFT PATELLA TENDON RUPTURE.  The various methods of treatment have been discussed with the patient and family. After consideration of risks, benefits and other options for treatment, the patient has consented to  Procedure(s): LEFT PATELLA TENDON PRIMARY REPAIR (Left) as a surgical intervention.  The patient's history has been reviewed, patient examined, no change in status, stable for surgery.  I have reviewed the patient's chart and labs.  Questions were answered to the patient's satisfaction.     Kathryne Hitch

## 2021-02-06 NOTE — Anesthesia Procedure Notes (Signed)
Anesthesia Regional Block: Femoral nerve block   Pre-Anesthetic Checklist: ,, timeout performed, Correct Patient, Correct Site, Correct Laterality, Correct Procedure, Correct Position, site marked, Risks and benefits discussed,  Surgical consent,  Pre-op evaluation,  At surgeon's request and post-op pain management  Laterality: Left  Prep: chloraprep       Needles:  Injection technique: Single-shot  Needle Type: Stimiplex     Needle Length: 9cm  Needle Gauge: 21     Additional Needles:   Procedures:,,,, ultrasound used (permanent image in chart),,,,  Narrative:  Start time: 02/06/2021 10:07 AM End time: 02/06/2021 10:12 AM Injection made incrementally with aspirations every 5 mL.  Performed by: Personally  Anesthesiologist: Lowella Curb, MD

## 2021-02-06 NOTE — Anesthesia Preprocedure Evaluation (Addendum)
Anesthesia Evaluation  Patient identified by MRN, date of birth, ID band Patient awake    Reviewed: Allergy & Precautions, H&P , NPO status , Patient's Chart, lab work & pertinent test results  Airway Mallampati: II  TM Distance: >3 FB Neck ROM: Full    Dental no notable dental hx.    Pulmonary Patient abstained from smoking., former smoker,    Pulmonary exam normal breath sounds clear to auscultation       Cardiovascular Normal cardiovascular exam Rhythm:Regular Rate:Normal     Neuro/Psych    GI/Hepatic   Endo/Other    Renal/GU      Musculoskeletal   Abdominal (+) + obese,   Peds  Hematology   Anesthesia Other Findings   Reproductive/Obstetrics                            Anesthesia Physical  Anesthesia Plan  ASA: II  Anesthesia Plan: General   Post-op Pain Management:  Regional for Post-op pain   Induction: Intravenous  PONV Risk Score and Plan: 2 and Ondansetron, Midazolam and Treatment may vary due to age or medical condition  Airway Management Planned: LMA  Additional Equipment:   Intra-op Plan:   Post-operative Plan: Extubation in OR  Informed Consent: I have reviewed the patients History and Physical, chart, labs and discussed the procedure including the risks, benefits and alternatives for the proposed anesthesia with the patient or authorized representative who has indicated his/her understanding and acceptance.     Dental advisory given  Plan Discussed with: CRNA and Surgeon  Anesthesia Plan Comments:         Anesthesia Quick Evaluation

## 2021-02-06 NOTE — Progress Notes (Signed)
Assisted Dr. Miller with left, ultrasound guided, femoral block. Side rails up, monitors on throughout procedure. See vital signs in flow sheet. Tolerated Procedure well. 

## 2021-02-06 NOTE — Anesthesia Procedure Notes (Signed)
Procedure Name: LMA Insertion Date/Time: 02/06/2021 11:05 AM Performed by: Thornell Mule, CRNA Pre-anesthesia Checklist: Patient identified, Emergency Drugs available, Suction available and Patient being monitored Patient Re-evaluated:Patient Re-evaluated prior to induction Oxygen Delivery Method: Circle system utilized Preoxygenation: Pre-oxygenation with 100% oxygen Induction Type: IV induction LMA: LMA inserted LMA Size: 4.0 Number of attempts: 1 Placement Confirmation: positive ETCO2 Tube secured with: Tape Dental Injury: Teeth and Oropharynx as per pre-operative assessment

## 2021-02-06 NOTE — Discharge Instructions (Signed)
You may put all of your weight on your left leg as comfort allows but only in the knee immobilizer and ambulating with crutches. Do pump your feet and ankles periodically through the day. Expect left knee swelling -ice and elevation as needed. Do not bend (flex) your knee at all or under any circumstances.  Keep your left knee straight. Continue wearing the knee immobilizer until further notice. Keep your dressings clean and dry.  May take Tylenol after 7:15pm, if needed.    Post Anesthesia Home Care Instructions  Activity: Get plenty of rest for the remainder of the day. A responsible individual must stay with you for 24 hours following the procedure.  For the next 24 hours, DO NOT: -Drive a car -Advertising copywriter -Drink alcoholic beverages -Take any medication unless instructed by your physician -Make any legal decisions or sign important papers.  Meals: Start with liquid foods such as gelatin or soup. Progress to regular foods as tolerated. Avoid greasy, spicy, heavy foods. If nausea and/or vomiting occur, drink only clear liquids until the nausea and/or vomiting subsides. Call your physician if vomiting continues.  Special Instructions/Symptoms: Your throat may feel dry or sore from the anesthesia or the breathing tube placed in your throat during surgery. If this causes discomfort, gargle with warm salt water. The discomfort should disappear within 24 hours.  If you had a scopolamine patch placed behind your ear for the management of post- operative nausea and/or vomiting:  1. The medication in the patch is effective for 72 hours, after which it should be removed.  Wrap patch in a tissue and discard in the trash. Wash hands thoroughly with soap and water. 2. You may remove the patch earlier than 72 hours if you experience unpleasant side effects which may include dry mouth, dizziness or visual disturbances. 3. Avoid touching the patch. Wash your hands with soap and water after  contact with the patch.    Regional Anesthesia Blocks  1. Numbness or the inability to move the "blocked" extremity may last from 3-48 hours after placement. The length of time depends on the medication injected and your individual response to the medication. If the numbness is not going away after 48 hours, call your surgeon.  2. The extremity that is blocked will need to be protected until the numbness is gone and the  Strength has returned. Because you cannot feel it, you will need to take extra care to avoid injury. Because it may be weak, you may have difficulty moving it or using it. You may not know what position it is in without looking at it while the block is in effect.  3. For blocks in the legs and feet, returning to weight bearing and walking needs to be done carefully. You will need to wait until the numbness is entirely gone and the strength has returned. You should be able to move your leg and foot normally before you try and bear weight or walk. You will need someone to be with you when you first try to ensure you do not fall and possibly risk injury.  4. Bruising and tenderness at the needle site are common side effects and will resolve in a few days.  5. Persistent numbness or new problems with movement should be communicated to the surgeon or the [facility] [phone] [facility] [phone]).

## 2021-02-06 NOTE — Transfer of Care (Signed)
Immediate Anesthesia Transfer of Care Note  Patient: HAMDI KLEY  Procedure(s) Performed: LEFT PATELLA TENDON PRIMARY REPAIR (Left Knee)  Patient Location: PACU  Anesthesia Type:General  Level of Consciousness: drowsy and patient cooperative  Airway & Oxygen Therapy: Patient Spontanous Breathing and Patient connected to face mask oxygen  Post-op Assessment: Report given to RN and Post -op Vital signs reviewed and stable  Post vital signs: Reviewed and stable  Last Vitals:  Vitals Value Taken Time  BP    Temp    Pulse 79 02/06/21 1218  Resp 21 02/06/21 1218  SpO2 98 % 02/06/21 1218  Vitals shown include unvalidated device data.  Last Pain:  Vitals:   02/06/21 0941  TempSrc: Oral  PainSc: 0-No pain         Complications: No complications documented.

## 2021-02-06 NOTE — Brief Op Note (Signed)
02/06/2021  11:59 AM  PATIENT:  Lily Kocher  55 y.o. male  PRE-OPERATIVE DIAGNOSIS:  LEFT PATELLA TENDON RUPTURE  POST-OPERATIVE DIAGNOSIS:  LEFT PATELLA TENDON RUPTURE  PROCEDURE:  Procedure(s) with comments: LEFT PATELLA TENDON PRIMARY REPAIR (Left) - block  SURGEON:  Surgeon(s) and Role:    Kathryne Hitch, MD - Primary  PHYSICIAN ASSISTANT:  Rexene Edison, PA-C  ANESTHESIA:   regional and general  COUNTS:  YES  TOURNIQUET:  * Missing tourniquet times found for documented tourniquets in log: [id number] *  DICTATION: .Other Dictation: Dictation Number [id number]  PLAN OF CARE: Discharge to home after PACU  PATIENT DISPOSITION:  PACU - hemodynamically stable.   Delay start of Pharmacological VTE agent (>24hrs) due to surgical blood loss or risk of bleeding: no

## 2021-02-07 ENCOUNTER — Encounter (HOSPITAL_BASED_OUTPATIENT_CLINIC_OR_DEPARTMENT_OTHER): Payer: Self-pay | Admitting: Orthopaedic Surgery

## 2021-02-07 NOTE — Op Note (Signed)
NAMEYOSHIMI, SARR MEDICAL RECORD NO: [id number] ACCOUNT NO: [id number] DATE OF BIRTH: [birth year] FACILITY: [facility] LOCATION: [facility] PHYSICIAN: Vanita Panda. Magnus Ivan, MD  Operative Report   DATE OF PROCEDURE: 02/06/2021   PREOPERATIVE DIAGNOSIS:  Left acute patellar tendon complete rupture.  POSTOPERATIVE DIAGNOSIS:  Left acute patellar tendon complete rupture.  PROCEDURE:  Direct primary repair of left knee patellar tendon.  SURGEON:  Doneen Poisson, M.D.  ASSISTANT:  Richardean Canal, PA-C.  ANESTHESIA:  1.  Left lower extremity femoral block. 2.  General.  TOURNIQUET TIME:  Under one hour.  ESTIMATED BLOOD LOSS:  Less than 50 mL  COMPLICATIONS:  None.  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  Trying to evert this and hang on the boxes he injured his  left knee.  He was seen in an outlying hospital and found to have an acute rupture, which was complaining of left knee patellar tendon.  He was placed in knee immobilizer and sought followup treatment in [place].  I saw him in the office this week and  he had an inability to extend his left knee.  He had a deficit of the patellar tendon and obvious patella alta with rupture of the patellar tendon.  We have recommended a direct primary repair of this tendon.  I described the surgery to him in detail as  well as explained the risks and benefits of surgery.  He understands that he will be able to weightbear as tolerated, but needs to keep his knee fully extended in a knee immobilizer until further notice.  We talked about the risk of acute blood loss  anemia, nerve or vessel injury, DVT, and infection.  We also talked about loss of fixation depending on his compliance and keep his knee extended.  DESCRIPTION OF PROCEDURE:  After informed consent was obtained,  appropriate left knee was marked.  Anesthesia obtained a femoral nerve block in the holding room.  He was brought to the operating room and placed supine on the operating table.  General  anesthesia via LMA was obtained, a nonsterile tourniquet was placed around his upper left thigh and his left thigh, knee, leg, and ankle were prepped and draped with DuraPrep and sterile drapes including a sterile stockinette.  A timeout was called to  identify correct patient, correct left knee.  We then wrap out the leg with an Esmarch and the tourniquet was inflated to 300 mm of pressure.  I then made a direct midline incision over the patella and carried this proximally and distally.  We dissected  down the knee joint and there was a very large seroma and effusion that we encountered and a complete disruption of the patellar tendon.  It was significantly shredded.  Once we were able to clean up the patellar tendon, we used #2 FiberWire suture x2 in  a [place] format running this through the patellar tendon.  I then placed 3 guide pins through the inferior pole of the patella and bring these pins out the superior pole of patella for [place], then placing a 3.5 cannulated drill to make 3 drill holes  through the patella.  I then brought each limb of the suture out through the medial and lateral aspects of the tunnel bridge we made and then 2 through the central area, so we could tie  these at the superior pole of patella to bring the patella tendon  back out to the patella itself.  Once we reduced this and tightened out, we used a #1 Ethibond suture to repair the retinaculum medially and laterally as well as oversew the patellar tendon to the soft tissue of the patella itself with #1 Ethibond suture  as well.  We were pleased with the repair after this.  We closed the deep tissue with 0 Vicryl followed by 2-0 Vicryl for subcutaneous tissue and the skin was reapproximated with staples.  Xeroform well-padded sterile dressing was  applied and the leg  was placed in a knee immobilizer.  He was awakened, extubated, and taken to recovery room in stable condition with all final counts being correct.  There were no complications noted.  Of note, Rexene Edison, PA-C did assist in entire case from beginning to  end and his assistance was crucial for facilitating all aspects of this case.   Xaver.Mink D: 02/06/2021 11:56:21 am T: 02/07/2021 5:24:00 am  JOB: [id number]/ [id number]

## 2021-02-11 ENCOUNTER — Telehealth: Payer: Self-pay | Admitting: Orthopaedic Surgery

## 2021-02-11 NOTE — Telephone Encounter (Signed)
Anything specific to tell pt?

## 2021-02-11 NOTE — Telephone Encounter (Signed)
I called pt and advised, he stated understanding

## 2021-02-11 NOTE — Telephone Encounter (Signed)
Needs to wear knee immobilizer at all times. WBAT . Follow up at 2 weeks post op.

## 2021-02-11 NOTE — Telephone Encounter (Signed)
Patient called. He says he lost his discharge papers with the after care instructions. Would like a call. [phone]

## 2021-02-17 ENCOUNTER — Inpatient Hospital Stay: Payer: No Typology Code available for payment source | Admitting: Physician Assistant

## 2021-02-19 ENCOUNTER — Encounter: Payer: Self-pay | Admitting: Physician Assistant

## 2021-02-19 ENCOUNTER — Ambulatory Visit (INDEPENDENT_AMBULATORY_CARE_PROVIDER_SITE_OTHER): Payer: No Typology Code available for payment source | Admitting: Physician Assistant

## 2021-02-19 DIAGNOSIS — S86812A Strain of other muscle(s) and tendon(s) at lower leg level, left leg, initial encounter: Secondary | ICD-10-CM

## 2021-02-19 NOTE — Progress Notes (Signed)
  HPI: Mr. Noboa returns today status post left patella tendon repair 02/06/2021.  He is overall doing well and has not removed the dressing.  In fact  has not removed the knee immobilizer.    Physical exam: General well-developed well-nourished male no acute distress. Left lower extremity: Surgical incisions well approximated staples no signs of infection.  No wound dehiscence.  Calf supple nontender and able dorsiflex plantarflex the ankle.  Impression: Status post left patellar tendon rupture repair  Plan: Staples removed Steri-Strips applied.  He is ambulating incision wet in shower but he needs to keep the leg straight at all times.  Outside of the shower he needs to keep the knee immobilizer on at all times he can open it up if he is just sitting with the leg propped up.  Weightbearing as tolerated on the left lower extremity in the knee brace.  We will see him back in 2 weeks most likely at that point time we will write him for a Bledsoe brace.  Questions were encouraged and answered at length.

## 2021-03-05 ENCOUNTER — Ambulatory Visit (INDEPENDENT_AMBULATORY_CARE_PROVIDER_SITE_OTHER): Payer: No Typology Code available for payment source | Admitting: Physician Assistant

## 2021-03-05 ENCOUNTER — Encounter: Payer: Self-pay | Admitting: Physician Assistant

## 2021-03-05 ENCOUNTER — Other Ambulatory Visit: Payer: Self-pay

## 2021-03-05 DIAGNOSIS — S86812D Strain of other muscle(s) and tendon(s) at lower leg level, left leg, subsequent encounter: Secondary | ICD-10-CM

## 2021-03-05 NOTE — Progress Notes (Signed)
HPI: Brian Cain returns today for follow-up of his left patellar tendon repair on 02/06/2021.  He states he is overall doing well.  He has some achiness.  He has been in knee immobilizer.  He is having no significant pain.  Physical exam: Left knee surgical incisions healing well no signs of infection.  Calf supple nontender.  Impression: Status post left patella tendon repair 02/06/2021  Plan: We will send him for a Bledsoe knee brace is to be locked out 0 to 40 degrees of flexion.  He will wear this whenever he is up and about.  He will work on scar tissue mobilization.  We will see him back in 2 weeks and open the brace up to 60 degrees of flexion.  Questions were

## 2021-03-19 ENCOUNTER — Ambulatory Visit (INDEPENDENT_AMBULATORY_CARE_PROVIDER_SITE_OTHER): Payer: No Typology Code available for payment source | Admitting: Physician Assistant

## 2021-03-19 ENCOUNTER — Encounter: Payer: Self-pay | Admitting: Physician Assistant

## 2021-03-19 ENCOUNTER — Other Ambulatory Visit: Payer: Self-pay

## 2021-03-19 DIAGNOSIS — S86812D Strain of other muscle(s) and tendon(s) at lower leg level, left leg, subsequent encounter: Secondary | ICD-10-CM

## 2021-03-19 NOTE — Progress Notes (Signed)
HPI: Jaimes returns today for follow-up of his left patellar tendon repair.  He is overall doing well.  He did obtain the Bledsoe knee brace 0 to 40 degrees.  He states that overall he has some pain but is improving.  He has no other complaints.  Physical exam: Left knee surgical incision is healed well.  Calf supple nontender.  Has full extension and flexion to approximately 45 degrees.  Impression: Status post left patella tendon repair 01/29/2021  Plan: Bledsoe brace is open 0 to 60 degrees.  We will have him follow-up with [place] in 2 weeks to open his knee brace up further to 0 to 90 degrees.  Questions encouraged and answered at length.

## 2021-04-02 ENCOUNTER — Other Ambulatory Visit: Payer: Self-pay

## 2021-04-02 ENCOUNTER — Encounter: Payer: Self-pay | Admitting: Physician Assistant

## 2021-04-02 ENCOUNTER — Ambulatory Visit (INDEPENDENT_AMBULATORY_CARE_PROVIDER_SITE_OTHER): Payer: No Typology Code available for payment source | Admitting: Physician Assistant

## 2021-04-02 DIAGNOSIS — S86812D Strain of other muscle(s) and tendon(s) at lower leg level, left leg, subsequent encounter: Secondary | ICD-10-CM

## 2021-04-02 NOTE — Progress Notes (Signed)
HPI: Brian Cain returns today just over 2 months status post left elbow tendon repair.  Overall doing well.  0 to 60 degrees in the Bledsoe brace.  Feels overall he is improving.  Some discomfort with flexion.  Physical exam: Left knee surgical incisions healed well no signs of infection.  He is able to do straight leg raise on the left.  Flexing to the approximately 50 degrees.  Full extension.  Impression: Status post left patella tendon repair  Plan: Bledsoe brace is open 0 to 90 degrees.  He will wear this for the next 2 weeks and discontinue the brace.  No contact sports for 3 months.  We will see him back in 1 month for final office visit.  Questions were encouraged and answered.

## 2021-04-30 ENCOUNTER — Ambulatory Visit (INDEPENDENT_AMBULATORY_CARE_PROVIDER_SITE_OTHER): Payer: No Typology Code available for payment source

## 2021-04-30 ENCOUNTER — Encounter: Payer: Self-pay | Admitting: Physician Assistant

## 2021-04-30 ENCOUNTER — Ambulatory Visit (INDEPENDENT_AMBULATORY_CARE_PROVIDER_SITE_OTHER): Payer: No Typology Code available for payment source | Admitting: Physician Assistant

## 2021-04-30 ENCOUNTER — Other Ambulatory Visit: Payer: Self-pay

## 2021-04-30 DIAGNOSIS — M19011 Primary osteoarthritis, right shoulder: Secondary | ICD-10-CM

## 2021-04-30 DIAGNOSIS — S86812D Strain of other muscle(s) and tendon(s) at lower leg level, left leg, subsequent encounter: Secondary | ICD-10-CM

## 2021-04-30 DIAGNOSIS — M25511 Pain in right shoulder: Secondary | ICD-10-CM

## 2021-04-30 DIAGNOSIS — G8929 Other chronic pain: Secondary | ICD-10-CM

## 2021-04-30 NOTE — Addendum Note (Signed)
Addended by: Barbette Or on: 04/30/2021 04:40 PM   Modules accepted: Orders

## 2021-04-30 NOTE — Addendum Note (Signed)
Addended by: Barbette Or on: 04/30/2021 04:27 PM   Modules accepted: Orders

## 2021-04-30 NOTE — Progress Notes (Signed)
Office Visit Note   Patient: Brian Cain           Date of Birth: 08-06-1966           MRN: [id number] Visit Date: 04/30/2021              Requested by: Fleet Contras, MD [address] [place],  [place] [address] PCP: Fleet Contras, MD   Assessment & Plan: Visit Diagnoses:  1. Chronic right shoulder pain   2. Patellar tendon rupture, left, subsequent encounter   3. Osteoarthritis of right shoulder, unspecified osteoarthritis type     Plan: He will be referred to physical therapy for range of motion strengthening of the left knee particularly quad strengthening.  Also work on gait balance.  Regards to his right shoulder we will obtain CT scan of the right shoulder with thin cuts.  Then he will follow-up with Dr. August Saucer in approximately 1 week after the CT scan to go over the results and discuss further treatment.  Regards to the left knee we will see him back in 6 weeks to see how he is doing overall.  Questions encouraged and answered at length.  No contact sports for another 2 months in regards to his knee left leg.  Follow-Up Instructions: Return in about 6 weeks (around 06/11/2021).   Orders:  Orders Placed This Encounter  Procedures   XR Shoulder Right   No orders of the defined types were placed in this encounter.     Procedures: No procedures performed   Clinical Data: No additional findings.   Subjective: Chief Complaint  Patient presents with   Left Knee - Routine Post Op   Right Shoulder - Pain    HPI [name] returns today 3 months status post left patella tendon repair.  States overall he is doing great.  He is using a crutch just for stability.  He does feel his knee is little bit weak first whenever he begins to ambulate.  Otherwise no complaints. Does ask about his right shoulder which is painful and has a lot of popping in it.  He has had no acute injury but has had decreased range of motion for some time.  He notes that he injured the  shoulder playing football and also doing wrestling in the remote past.  He is asking for a look at shoulder he feels that shoulder may pop out of place at times.  Review of Systems See HPI  Objective: Vital Signs: There were no vitals taken for this visit.  Physical Exam Constitutional:      Appearance: He is not ill-appearing or diaphoretic.  Pulmonary:     Effort: Pulmonary effort is normal.  Neurological:     Mental Status: He is alert.  Psychiatric:        Behavior: Behavior normal.    Ortho Exam Left knee examined do straight leg raise.  He has full extension full flexion.  Slight effusion no abnormal warmth or erythema.  Atrophy of the quad muscles.  Surgical incisions well-healed calf soft nontender.  Right shoulder he has decreased forward flexion only to about 90 degrees.  Attempts of internal and external rotation cause pain and create significant crepitus.  Specialty Comments:  No specialty comments available.  Imaging: XR Shoulder Right  Result Date: 04/30/2021 Right shoulder 3 views: Shoulders well located.  There is severe end-stage arthritic/erosive changes involving the humeral head.  The humeral head is misshapen and with periarticular osteophytes.  No acute fractures  seen.    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 No pertinent past medical history.  Family History  Problem Relation Age of Onset   Cancer Mother        breast    Past Surgical History:  Procedure Laterality Date   CLOSED REDUCTION MANDIBLE WITH MANDIBULOMA N/A 02/16/2014   Procedure: CLOSED REDUCTION MANDIBLE WITH MANDIBULOMAXILLARY FUSION;  Surgeon: Serena Colonel, MD;  [facility];  Service: ENT;  Laterality: N/A;   EXTERNAL  FIXATOR AND ARCH BAR REMOVAL Bilateral 02/16/2014   Procedure:  ARCH BARS PLACEMENT;  Surgeon: Serena Colonel, MD;  Location: [facility] OR;  Service: ENT;  Laterality: Bilateral;   I & D EXTREMITY  08/22/2011   Procedure: IRRIGATION AND DEBRIDEMENT EXTREMITY;  Surgeon: Tami Ribas;  Location: [facility] OR;  Service: Orthopedics;  Laterality: Right;   MANDIBULAR HARDWARE REMOVAL N/A 04/09/2014   Procedure: MANDIBULAR HARDWARE REMOVAL;  Surgeon: Serena Colonel, MD;  Location: [facility];  Service: ENT;  Laterality: N/A;   PATELLAR TENDON REPAIR Left 02/06/2021   Procedure: LEFT PATELLA TENDON PRIMARY REPAIR;  Surgeon: Kathryne Hitch, MD;  Location: [facility];  Service: Orthopedics;  Laterality: Left;  block   WRIST SURGERY     Social History   Occupational History   Not on file  Tobacco Use   Smoking status: Former    Packs/day: 0.30    Years: 3.00    Pack years: 0.90    Types: Cigarettes    Quit date: 02/24/2012    Years since quitting: 9.1   Smokeless tobacco: Never  Substance and Sexual Activity   Alcohol use: Yes    Comment: occ   Drug use: Yes    Types: Marijuana   Sexual activity: Yes    Birth control/protection: Condom    Comment: 5 daily

## 2021-05-12 ENCOUNTER — Ambulatory Visit: Payer: No Typology Code available for payment source | Attending: Physician Assistant | Admitting: Physical Therapy

## 2021-05-19 ENCOUNTER — Other Ambulatory Visit: Payer: No Typology Code available for payment source

## 2021-06-11 ENCOUNTER — Ambulatory Visit: Payer: No Typology Code available for payment source | Admitting: Physician Assistant

## 2021-07-07 ENCOUNTER — Ambulatory Visit: Payer: No Typology Code available for payment source | Admitting: Physician Assistant

## 2021-10-23 ENCOUNTER — Other Ambulatory Visit: Payer: Self-pay

## 2021-10-23 ENCOUNTER — Ambulatory Visit (INDEPENDENT_AMBULATORY_CARE_PROVIDER_SITE_OTHER): Payer: No Typology Code available for payment source | Admitting: Orthopaedic Surgery

## 2021-10-23 ENCOUNTER — Ambulatory Visit (INDEPENDENT_AMBULATORY_CARE_PROVIDER_SITE_OTHER): Payer: No Typology Code available for payment source

## 2021-10-23 ENCOUNTER — Encounter: Payer: Self-pay | Admitting: Orthopaedic Surgery

## 2021-10-23 DIAGNOSIS — M25562 Pain in left knee: Secondary | ICD-10-CM

## 2021-10-23 DIAGNOSIS — S86812S Strain of other muscle(s) and tendon(s) at lower leg level, left leg, sequela: Secondary | ICD-10-CM

## 2021-10-23 DIAGNOSIS — M1712 Unilateral primary osteoarthritis, left knee: Secondary | ICD-10-CM

## 2021-10-23 MED ORDER — METHYLPREDNISOLONE ACETATE 40 MG/ML IJ SUSP
40.0000 mg | INTRAMUSCULAR | Status: AC | PRN
  Administered 2021-10-23: 40 mg via INTRA_ARTICULAR

## 2021-10-23 MED ORDER — LIDOCAINE HCL 1 % IJ SOLN
3.0000 mL | INTRAMUSCULAR | Status: AC | PRN
  Administered 2021-10-23: 3 mL

## 2021-10-23 NOTE — Progress Notes (Signed)
Office Visit Note   Patient: Brian Cain           Date of Birth: 09-30-1966           MRN: [id number] Visit Date: 10/23/2021              Requested by: Nolene Ebbs, MD [address] [place],  [place] [address] PCP: Nolene Ebbs, MD   Assessment & Plan: Visit Diagnoses:  1. Acute pain of left knee   2. Unilateral primary osteoarthritis, left knee   3. Patellar tendon rupture, left, sequela     Plan: I did go over his x-rays with him and given reassurance that his repair of the patella tendon is intact.  He unfortunately does have significant arthritis in both his knees.  I was able to aspirate almost 30 cc of fluid from the left knee and place a sterile injection of the left knee.  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  I would like to see him back in a month to make sure he is doing well.  Follow-Up Instructions: Return in about 4 weeks (around 11/20/2021).   Orders:  Orders Placed This Encounter  Procedures   Large Joint Inj   XR Knee 1-2 Views Left   No orders of the defined types were placed in this encounter.     Procedures: Large Joint Inj: L knee on 10/23/2021 2:52 PM Indications: diagnostic evaluation and pain Details: 22 G 1.5 in needle, superolateral approach  Arthrogram: No  Medications: 3 mL lidocaine 1 %; 40 mg methylPREDNISolone acetate 40 MG/ML Outcome: tolerated well, no immediate complications Procedure, treatment alternatives, risks and benefits explained, specific risks discussed. Consent was given by the patient. Immediately prior to procedure a time out was called to verify the correct patient, procedure, equipment, support staff and site/side marked as required. Patient was prepped and draped in the usual sterile fashion.      Clinical Data: No additional findings.   Subjective: Chief Complaint  Patient  presents with   Left Knee - Follow-up  The patient has a history of a left knee patella tendon rupture and underwent a repair in April of last year.  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  He says it kind of fell off the bed he feels like the knee is out of place.  He says he does have pain to put weight down on his left knee and it feels like it wants to pop out of place he states.  HPI  Review of Systems He currently denies any headache, chest pain, shortness of breath, fever, chills, nausea, vomiting  Objective: Vital Signs: There were no vitals taken for this visit.  Physical Exam He is alert and orient x3 and in no acute distress Ortho Exam Examination of both knees shows that his left knee does have a moderate effusion.  His extensor mechanism is intact.  He can hold his knee fully extended and flexes back and then can hold it extended against resistance as well.  There is some patellofemoral crepitation and medial lateral joint line tenderness. Specialty Comments:  No specialty comments available.  Imaging: XR Knee 1-2 Views Left  Result Date: 10/23/2021 2 views of the left knee show  tricompartment arthritic changes.  There has been previous surgery for the patella tendon but the patella is not high riding.  The AP view shows both knees standing in both knees after, arthritis and the patellas are in the same position.    PMFS History: Patient Active Problem List   Diagnosis Date Noted   Patellar tendon rupture, left, subsequent encounter 02/05/2021   Hidradenitis suppurativa 12/12/2020   Open body of mandible fracture ([place]) 02/16/2014   Bee sting 06/08/2012   Osteomyelitis of hand ([place]) 10/26/2011   No past medical history on file.  Family History  Problem Relation Age of Onset   Cancer Mother        breast    Past Surgical History:  Procedure Laterality Date   CLOSED REDUCTION  MANDIBLE WITH MANDIBULOMA N/A 02/16/2014   Procedure: CLOSED REDUCTION MANDIBLE WITH MANDIBULOMAXILLARY FUSION;  Surgeon: Izora Gala, MD;  Location: [facility];  Service: ENT;  Laterality: N/A;   EXTERNAL FIXATOR AND ARCH BAR REMOVAL Bilateral 02/16/2014   Procedure:  ARCH BARS PLACEMENT;  Surgeon: Izora Gala, MD;  Location: [facility];  Service: ENT;  Laterality: Bilateral;   I & D EXTREMITY  08/22/2011   Procedure: IRRIGATION AND DEBRIDEMENT EXTREMITY;  Surgeon: Tennis Must;  Location: [facility];  Service: Orthopedics;  Laterality: Right;   MANDIBULAR HARDWARE REMOVAL N/A 04/09/2014   Procedure: MANDIBULAR HARDWARE REMOVAL;  Surgeon: Izora Gala, MD;  Location: [facility];  Service: ENT;  Laterality: N/A;   PATELLAR TENDON REPAIR Left 02/06/2021   Procedure: LEFT PATELLA TENDON PRIMARY REPAIR;  Surgeon: Mcarthur Rossetti, MD;  Location: [facility];  Service: Orthopedics;  Laterality: Left;  block   WRIST SURGERY     Social History   Occupational History   Not on file  Tobacco Use   Smoking status: Former    Packs/day: 0.30    Years: 3.00    Pack years: 0.90    Types: Cigarettes    Quit date: 02/24/2012    Years since quitting: 9.6   Smokeless tobacco: Never  Substance and Sexual Activity   Alcohol use: Yes    Comment: occ   Drug use: Yes    Types: Marijuana   Sexual activity: Yes    Birth control/protection: Condom    Comment: 5 daily

## 2021-11-06 ENCOUNTER — Ambulatory Visit: Payer: No Typology Code available for payment source | Admitting: Orthopaedic Surgery

## 2021-11-13 ENCOUNTER — Ambulatory Visit: Payer: No Typology Code available for payment source | Admitting: Physician Assistant

## 2021-11-24 ENCOUNTER — Other Ambulatory Visit: Payer: Self-pay

## 2021-11-24 ENCOUNTER — Ambulatory Visit (INDEPENDENT_AMBULATORY_CARE_PROVIDER_SITE_OTHER): Payer: No Typology Code available for payment source | Admitting: Physician Assistant

## 2021-11-24 ENCOUNTER — Encounter: Payer: Self-pay | Admitting: Physician Assistant

## 2021-11-24 ENCOUNTER — Ambulatory Visit (INDEPENDENT_AMBULATORY_CARE_PROVIDER_SITE_OTHER): Payer: No Typology Code available for payment source

## 2021-11-24 DIAGNOSIS — M25511 Pain in right shoulder: Secondary | ICD-10-CM

## 2021-11-24 NOTE — Progress Notes (Signed)
HPI: Mr. Geffre returns today for follow-up of his left knee pain.  He states that the injection aspiration did help.  He is able to bear full weight on the left knee.  He is wearing copper fit knee braces which she finds beneficial.  His main complaint however today is his right shoulder which we saw him for last summer he has end-stage arthritis of the shoulder.  He feels that shoulder is popping out of joint.  He was to be scheduled for CT scan of his shoulder and then follow-up with Dr. August Saucer after this.  Unfortunately he did not undergo CT scan.  He is having increasing right shoulder pain.   Physical exam: Bilateral knees excellent range of motion of both knees without pain no instability with valgus varus stressing of either knee.  No abnormal warmth erythema or effusion of either knee.  He is able to actively fully extend the left knee.  Right shoulder decreased range of motion only able to bring total approximately 90 degrees of forward flexion and abduction.  Internal/external rotation cause significant crepitus and pain.  Radiographs right shoulder 3 views shoulder is well located.  No acute fractures or acute finding.  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  Impression: End-stage arthritis left shoulder History of left patella tendon rupture Left knee osteoarthritis  Plan: Regards to the left knee is activities as tolerated.  Right shoulder we will have him undergo CT scan of the shoulder with thin cuts follow-up with Dr. August Saucer approximately 4 to 5 days after the scan to go over the results and discuss further treatment.  In regards to follow-up with Dr. Magnus Ivan and myself this can be on an as-needed basis peer

## 2021-12-16 ENCOUNTER — Telehealth: Payer: Self-pay | Admitting: Orthopaedic Surgery

## 2021-12-16 NOTE — Telephone Encounter (Signed)
LMOM for pt to return call and sch for post MRI after 12/17/21 with CB/GC ?

## 2022-03-31 ENCOUNTER — Ambulatory Visit
Admission: RE | Admit: 2022-03-31 | Discharge: 2022-03-31 | Disposition: A | Discharge status: Home / Self Care | Payer: Self-pay | Source: Ambulatory Visit | Attending: Physician Assistant | Admitting: Physician Assistant

## 2022-03-31 DIAGNOSIS — M25511 Pain in right shoulder: Secondary | ICD-10-CM

## 2022-04-06 ENCOUNTER — Encounter: Payer: Self-pay | Admitting: Physician Assistant

## 2022-04-06 ENCOUNTER — Ambulatory Visit (INDEPENDENT_AMBULATORY_CARE_PROVIDER_SITE_OTHER): Payer: No Typology Code available for payment source | Admitting: Orthopedic Surgery

## 2022-04-06 DIAGNOSIS — M25511 Pain in right shoulder: Secondary | ICD-10-CM

## 2022-04-09 ENCOUNTER — Encounter: Payer: Self-pay | Admitting: Orthopedic Surgery

## 2022-04-09 NOTE — Progress Notes (Signed)
Office Visit Note   Patient: Brian Cain           Date of Birth: [birth year]           MRN: [id number] Visit Date: 04/06/2022 Requested by: Fleet Contras, MD [address] [place],  [place] [address] PCP: Fleet Contras, MD  Subjective: Chief Complaint  Patient presents with   Right Shoulder - Follow-up    HPI: [name] is a 56 year old patient with right shoulder pain.  Had a fall last year during the fall season and initially it was not painful but it did become painful.  He is right-hand dominant.  Has pain which wakes him from sleep at night.  Reports shoulder feeling stiff in the morning.  Reports decreased range of motion with some popping.  He works as a Naval architect.  Does smoke marijuana for pain relief.  Having increasing tics and unusual type behavior which the wife is concerned may be Tourette's syndrome or something similar.  CT scan of the right shoulder does show severe osteoarthritis of the glenohumeral joint with significant deformity as well as bone spurring and osteophytosis.  Significant inferior osteophyte formation is present.  Multiple intra-articular loose bodies in the axillary recess.              ROS: All systems reviewed are negative as they relate to the chief complaint within the history of present illness.  Patient denies  fevers or chills.   Assessment & Plan: Visit Diagnoses:  1. Right shoulder pain, unspecified chronicity     Plan: Impression is right shoulder arthritis.  Plan is right shoulder replacement which would likely be reverse replacement.  The risk and benefits of surgery are discussed with Rosalie and his wife including not limited to infection or vessel damage instability incomplete pain relief as well as incomplete restoration of function.  The limitations of both reverse shoulder replacement and total shoulder replacement are discussed.  Patient understands risk benefits and wishes to proceed however there is this issue of the  neurological problem where he is having a lot of tics and unusual twitches.  Wife would like to have this checked out prior to surgical intervention.  Could be related to the amount of marijuana that he is using to control his shoulder pain.  We will refer to neurology for here or in [place] which ever can be done quickest.  Plan to post this patient for surgery after that neurology appointment.  Follow-Up Instructions: No follow-ups on file.   Orders:  Orders Placed This Encounter  Procedures   Ambulatory referral to Neurology   No orders of the defined types were placed in this encounter.     Procedures: No procedures performed   Clinical Data: No additional findings.  Objective: Vital Signs: There were no vitals taken for this visit.  Physical Exam:   Constitutional: Patient appears well-developed HEENT:  Head: Normocephalic Eyes:EOM are normal Neck: Normal range of motion Cardiovascular: Normal rate Pulmonary/chest: Effort normal Neurologic: Patient is alert Skin: Skin is warm Psychiatric: Patient has normal mood and affect   Ortho Exam: Ortho exam demonstrates shoulder range of motion on the right of 0/30/90.  On the left passive motion is 70/110/140.  Rotator cuff strength pretty reasonable but the motion is so limited it is hard to fully assess.  Deltoid is functional.  Skin intact in the shoulder girdle region.  Specialty Comments:  No specialty comments available.  Imaging: No results found.   PMFS History: Patient Active  Problem List   Diagnosis Date Noted   Patellar tendon rupture, left, subsequent encounter 02/05/2021   Hidradenitis suppurativa 12/12/2020   Open body of mandible fracture (HCC) 02/16/2014   Bee sting 06/08/2012   Osteomyelitis of hand (HCC) 10/26/2011   History reviewed. No pertinent past medical history.  Family History  Problem Relation Age of Onset   Cancer Mother        breast    Past Surgical History:  Procedure Laterality  Date   CLOSED REDUCTION MANDIBLE WITH MANDIBULOMA N/A 02/16/2014   Procedure: CLOSED REDUCTION MANDIBLE WITH MANDIBULOMAXILLARY FUSION;  Surgeon: Serena Colonel, MD;  Location: [facility] OR;  Service: ENT;  Laterality: N/A;   EXTERNAL FIXATOR AND ARCH BAR REMOVAL Bilateral 02/16/2014   Procedure:  ARCH BARS PLACEMENT;  Surgeon: Serena Colonel, MD;  Location: [facility] OR;  Service: ENT;  Laterality: Bilateral;   I & D EXTREMITY  08/22/2011   Procedure: IRRIGATION AND DEBRIDEMENT EXTREMITY;  Surgeon: Tami Ribas;  Location: [facility] OR;  Service: Orthopedics;  Laterality: Right;   MANDIBULAR HARDWARE REMOVAL N/A 04/09/2014   Procedure: MANDIBULAR HARDWARE REMOVAL;  Surgeon: Serena Colonel, MD;  Location: [facility];  Service: ENT;  Laterality: N/A;   PATELLAR TENDON REPAIR Left 02/06/2021   Procedure: LEFT PATELLA TENDON PRIMARY REPAIR;  Surgeon: Kathryne Hitch, MD;  Location: [facility];  Service: Orthopedics;  Laterality: Left;  block   WRIST SURGERY     Social History   Occupational History   Not on file  Tobacco Use   Smoking status: Former    Packs/day: 0.30    Years: 3.00    Total pack years: 0.90    Types: Cigarettes    Quit date: 02/24/2012    Years since quitting: 10.1   Smokeless tobacco: Never  Substance and Sexual Activity   Alcohol use: Yes    Comment: occ   Drug use: Yes    Types: Marijuana   Sexual activity: Yes    Birth control/protection: Condom    Comment: 5 daily

## 2022-04-27 ENCOUNTER — Other Ambulatory Visit: Payer: Self-pay | Admitting: Physician Assistant

## 2022-04-27 DIAGNOSIS — M19011 Primary osteoarthritis, right shoulder: Secondary | ICD-10-CM

## 2022-04-27 DIAGNOSIS — G8929 Other chronic pain: Secondary | ICD-10-CM

## 2022-06-08 ENCOUNTER — Ambulatory Visit: Payer: No Typology Code available for payment source | Admitting: Neurology

## 2022-06-08 ENCOUNTER — Encounter: Payer: Self-pay | Admitting: Neurology

## 2022-07-14 ENCOUNTER — Encounter: Payer: Self-pay | Admitting: Neurology

## 2022-07-14 ENCOUNTER — Ambulatory Visit (INDEPENDENT_AMBULATORY_CARE_PROVIDER_SITE_OTHER): Payer: Commercial Managed Care - HMO | Admitting: Neurology

## 2022-07-14 VITALS — BP 183/107 | HR 71 | Ht 71.0 in | Wt 233.5 lb

## 2022-07-14 DIAGNOSIS — M542 Cervicalgia: Secondary | ICD-10-CM | POA: Diagnosis not present

## 2022-07-14 DIAGNOSIS — R259 Unspecified abnormal involuntary movements: Secondary | ICD-10-CM | POA: Diagnosis not present

## 2022-07-14 MED ORDER — ALPRAZOLAM 1 MG PO TABS: ORAL_TABLET | ORAL | 0 refills | Status: DC

## 2022-07-14 NOTE — Progress Notes (Signed)
Chief Complaint  Patient presents with   New Patient (Initial Visit)    Rm 14. Alone. NP Internal referral for tics and unusual twitches. States he feels like shoulder is out of alignment. He has extreme pain when sitting, was tearful.      ASSESSMENT AND PLAN  [name] is a 56 y.o. male   Intermittent neck jerking movement  Can be easily distracted, seems to have a volunteer component  MRI of cervical spine, and brain to rule out structural abnormality  Laboratory evaluations including TSH, ceruloplasmin    DIAGNOSTIC DATA (LABS, IMAGING, TESTING) - I reviewed patient records, labs, notes, testing and imaging myself where available.   MEDICAL HISTORY:  Brian Cain, is a 56 year old male seen in request by Dr. Nolene Cain, for abnormal neck twitching, initial evaluation was on July 14, 2022, patient is alone at today's visit.  I reviewed and summarized the referring note.PMHX.  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, twisted his left leg in awkward way, February left patellar tendon rupture, required surgery,  He was able to go back to driving few months later, last driving was in May 9924, while driving to [place] through [place] area, he had a sudden body jerking towards his left side, he is weird, become very close to the rail, rarely has startled him, he has to lie on the roadside from 5 AM to 9 AM, has quit driving since then  He reported since spring 2023, he began to experience body jerking movement, usually triggered by certain movement, he would have forceful neck jerking to the left side, during today's interview, with distraction, he was fairly normal, but often has sudden onset neck jerking movement to the left side  Also complains of severe right shoulder pain, CT of right shoulder on April 01, 2022 showed severe glenohumeral osteoarthritis with possible ankylosis of the  joint space inferiorly  His abnormal jerking movement become more frequent, less predictable, affecting his daily life, sometimes he does not feel comfortable driving his personal vehicle,  PHYSICAL EXAM:   Vitals:   07/14/22 1102 07/14/22 1106  BP: (!) 188/116 (!) 183/107  Pulse: 81 71  Weight: 233 lb 8 oz (105.9 kg) 233 lb 8 oz (105.9 kg)  Height: 5\' 11"  (1.803 m) 5\' 11"  (1.803 m)     Body mass index is 32.57 kg/m.  PHYSICAL EXAMNIATION:  Gen: NAD, conversant, well nourised, well groomed                     Cardiovascular: Regular rate rhythm, no peripheral edema, warm, nontender. Eyes: Conjunctivae clear without exudates or hemorrhage Neck: Supple, no carotid bruits. Pulmonary: Clear to auscultation bilaterally   NEUROLOGICAL EXAM:  MENTAL STATUS: Speech/cognition: Awake, alert, oriented to history taking and casual conversation CRANIAL NERVES: CN II: Visual fields are full to confrontation. Pupils are round equal and briskly reactive to light. CN III, IV, VI: extraocular movement are normal. No ptosis. CN V: Facial sensation is intact to light touch CN VII: Face is symmetric with normal eye closure  CN VIII: Hearing is normal to causal conversation. CN IX, X: Phonation is normal. CN XI: Head turning and shoulder shrug are intact  MOTOR: There is no pronator drift of out-stretched arms. Muscle bulk and tone are normal. Muscle strength is normal.  REFLEXES: Reflexes are 2+ and symmetric at the biceps, triceps, knees, and ankles.  Plantar responses are flexor.  SENSORY: Intact to light touch, pinprick and vibratory sensation are intact in fingers and toes.  COORDINATION: There is no trunk or limb dysmetria noted.  GAIT/STANCE: Posture is normal. Gait is steady with normal steps, base, arm swing, and turning. Heel and toe walking are normal. Tandem gait is normal.  Romberg is absent.  REVIEW OF SYSTEMS:  Full 14 system review of systems performed and notable  only for as above All other review of systems were negative.   ALLERGIES: Allergies  Allergen Reactions   Bee Venom Swelling    HOME MEDICATIONS: Current Outpatient Medications  Medication Sig Dispense Refill   ibuprofen (ADVIL) 200 MG tablet Take 200 mg by mouth every 6 (six) hours as needed.     Multiple Vitamin (MULTIVITAMIN) tablet Take 1 tablet by mouth daily.     No current facility-administered medications for this visit.    PAST MEDICAL HISTORY: History reviewed. No pertinent past medical history.  PAST SURGICAL HISTORY: Past Surgical History:  Procedure Laterality Date   CLOSED REDUCTION MANDIBLE WITH MANDIBULOMA N/A 02/16/2014   Procedure: CLOSED REDUCTION MANDIBLE WITH MANDIBULOMAXILLARY FUSION;  Surgeon: Izora Gala, MD;  Location: [facility];  Service: ENT;  Laterality: N/A;   EXTERNAL FIXATOR AND ARCH BAR REMOVAL Bilateral 02/16/2014   Procedure:  ARCH BARS PLACEMENT;  Surgeon: Izora Gala, MD;  Location: [facility];  Service: ENT;  Laterality: Bilateral;   I & D EXTREMITY  08/22/2011   Procedure: IRRIGATION AND DEBRIDEMENT EXTREMITY;  Surgeon: Tennis Must;  Location: [facility];  Service: Orthopedics;  Laterality: Right;   MANDIBULAR HARDWARE REMOVAL N/A 04/09/2014   Procedure: MANDIBULAR HARDWARE REMOVAL;  Surgeon: Izora Gala, MD;  Location: [facility];  Service: ENT;  Laterality: N/A;   PATELLAR TENDON REPAIR Left 02/06/2021   Procedure: LEFT PATELLA TENDON PRIMARY REPAIR;  Surgeon: Mcarthur Rossetti, MD;  Location: [facility];  Service: Orthopedics;  Laterality: Left;  block   WRIST SURGERY      FAMILY HISTORY: Family History  Problem Relation Age of Onset   Cancer Mother        breast    SOCIAL HISTORY: Social History   Socioeconomic History   Marital status: Single    Spouse name: Not on file   Number of children: Not on file   Years of education: Not on file   Highest education level: Not on file  Occupational History   Not  on file  Tobacco Use   Smoking status: Former    Packs/day: 0.30    Years: 3.00    Total pack years: 0.90    Types: Cigarettes    Quit date: 02/24/2012    Years since quitting: 10.3   Smokeless tobacco: Never  Substance and Sexual Activity   Alcohol use: Yes    Comment: occ   Drug use: Yes    Types: Marijuana   Sexual activity: Yes    Birth control/protection: Condom    Comment: 5 daily  Other Topics Concern   Not on file  Social History Narrative   ** Merged History Encounter **       Social Determinants of Health   Financial Resource Strain: Not on file  Food Insecurity: Not on file  Transportation Needs: Not on file  Physical Activity: Not on file  Stress: Not on file  Social Connections: Not on file  Intimate Partner Violence: Not on file      Marcial Pacas, M.D. Ph.D.  [facility] [address], [place], [place] [address] Ph: [phone] Fax: [phone]  CC:  Brian Ebbs, MD [address] [place],  [place] [address]  Brian Ebbs, MD

## 2022-07-15 ENCOUNTER — Ambulatory Visit (INDEPENDENT_AMBULATORY_CARE_PROVIDER_SITE_OTHER): Payer: Commercial Managed Care - HMO | Admitting: Neurology

## 2022-07-15 DIAGNOSIS — M542 Cervicalgia: Secondary | ICD-10-CM

## 2022-07-15 DIAGNOSIS — R259 Unspecified abnormal involuntary movements: Secondary | ICD-10-CM

## 2022-07-16 ENCOUNTER — Telehealth: Payer: Self-pay | Admitting: Neurology

## 2022-07-16 NOTE — Telephone Encounter (Signed)
PHCS NPR sent to GI 

## 2022-07-22 LAB — CBC WITH DIFFERENTIAL/PLATELET
Basophils Absolute: 0 10*3/uL (ref 0.0–0.2)
Basos: 0 %
EOS (ABSOLUTE): 0.1 10*3/uL (ref 0.0–0.4)
Eos: 1 %
Hematocrit: 41.8 % (ref 37.5–51.0)
Hemoglobin: 14.2 g/dL (ref 13.0–17.7)
Immature Grans (Abs): 0 10*3/uL (ref 0.0–0.1)
Immature Granulocytes: 0 %
Lymphocytes Absolute: 3.5 10*3/uL — ABNORMAL HIGH (ref 0.7–3.1)
Lymphs: 42 %
MCH: 30.8 pg (ref 26.6–33.0)
MCHC: 34 g/dL (ref 31.5–35.7)
MCV: 91 fL (ref 79–97)
Monocytes Absolute: 0.6 10*3/uL (ref 0.1–0.9)
Monocytes: 7 %
Neutrophils Absolute: 4.1 10*3/uL (ref 1.4–7.0)
Neutrophils: 50 %
Platelets: 262 10*3/uL (ref 150–450)
RBC: 4.61 x10E6/uL (ref 4.14–5.80)
RDW: 13.8 % (ref 11.6–15.4)
WBC: 8.3 10*3/uL (ref 3.4–10.8)

## 2022-07-22 LAB — COMPREHENSIVE METABOLIC PANEL
ALT: 18 IU/L (ref 0–44)
AST: 13 IU/L (ref 0–40)
Albumin/Globulin Ratio: 1.4 (ref 1.2–2.2)
Albumin: 4.1 g/dL (ref 3.8–4.9)
Alkaline Phosphatase: 139 IU/L — ABNORMAL HIGH (ref 44–121)
BUN/Creatinine Ratio: 11 (ref 9–20)
BUN: 11 mg/dL (ref 6–24)
Bilirubin Total: <0.2 mg/dL (ref 0.0–1.2)
CO2: 23 mmol/L (ref 20–29)
Calcium: 9.5 mg/dL (ref 8.7–10.2)
Chloride: 105 mmol/L (ref 96–106)
Creatinine, Ser: 1.04 mg/dL (ref 0.76–1.27)
Globulin, Total: 2.9 g/dL (ref 1.5–4.5)
Glucose: 110 mg/dL — ABNORMAL HIGH (ref 70–99)
Potassium: 4.4 mmol/L (ref 3.5–5.2)
Sodium: 141 mmol/L (ref 134–144)
Total Protein: 7 g/dL (ref 6.0–8.5)
eGFR: 85 mL/min/{1.73_m2} (ref >59)

## 2022-07-22 LAB — TSH: TSH: 0.97 u[IU]/mL (ref 0.450–4.500)

## 2022-07-22 LAB — CERULOPLASMIN: Ceruloplasmin: 32.2 mg/dL — ABNORMAL HIGH (ref 16.0–31.0)

## 2022-07-22 LAB — CK: Total CK: 199 U/L (ref 41–331)

## 2022-07-22 LAB — COPPER, SERUM: Copper: 137 ug/dL — ABNORMAL HIGH (ref 69–132)

## 2022-07-23 ENCOUNTER — Other Ambulatory Visit: Payer: Self-pay

## 2022-07-23 ENCOUNTER — Telehealth: Payer: Self-pay

## 2022-07-23 NOTE — Procedures (Signed)
   HISTORY: 56 year old male with intermittent body jerking movement  TECHNIQUE:  This is a routine 16 channel EEG recording with one channel devoted to a limited EKG recording.  It was performed during wakefulness, drowsiness and asleep.  Hyperventilation and photic stimulation were performed as activating procedures.  There are minimum muscle and movement artifact noted.  Upon maximum arousal, posterior dominant waking rhythm consistent of rhythmic alpha range activity. Activities are symmetric over the bilateral posterior derivations and attenuated with eye opening.  Hyperventilation produced mild/moderate buildup with higher amplitude and the slower activities noted.  Photic stimulation did not alter the tracing.  During EEG recording, patient developed drowsiness and no deeper stage of sleep was achieved  During EEG recording, there was no epileptiform discharge noted.  EKG demonstrate sinus rhythm, with heart rate of 68 bpm  CONCLUSION: This is a  normal awake EEG.  There is no electrodiagnostic evidence of epileptiform discharge.  Marcial Pacas, M.D. Ph.D.  [facility] [place], [place] [address] Phone: [phone] Fax:      [phone]

## 2022-07-23 NOTE — Telephone Encounter (Signed)
Contacted pt, informed him of lab results and answered questions to his satisfactory. Informed to ensure he FU with PCP regarding elevated levels. Pt verbally understood and was appreciative. Advised to call back with questions and was appreciative.

## 2022-07-23 NOTE — Telephone Encounter (Signed)
-----   Message from Marcial Pacas, MD sent at 07/22/2022  8:46 AM EDT ----- Please call patient, laboratory evaluation showed  --- Mild elevated alkaline phosphate, may continue follow-up with his primary care physician  --- Slight elevation of copper 137, ceruloplasmin 32.2, above no clinical significance, may consider repeat laboratory evaluations  --- I have forwarded the lab to his primary care physician,

## 2022-07-31 NOTE — Pre-Procedure Instructions (Signed)
Surgical Instructions    Your procedure is scheduled on Tuesday, August 11, 2022 at 7:30 AM.  Report to [facility] Main Entrance "A" at 5:30 A.M., then check in with the Admitting office.  Call this number if you have problems the morning of surgery:  [phone]   If you have any questions prior to your surgery date call [phone]: Open Monday-Friday 8am-4pm  *If you experience any cold or flu symptoms such as cough, fever, chills, shortness of breath, etc. between now and your scheduled surgery, please notify us.*    Remember:  Do not eat after midnight the night before your surgery  You may drink clear liquids until 4:30 AM the morning of your surgery.   Clear liquids allowed are: Water, Non-Citrus Juices (without pulp), Carbonated Beverages, Clear Tea, Black Coffee Only (NO MILK, CREAM OR POWDERED CREAMER of any kind), and Gatorade.   Enhanced Recovery after Surgery for Orthopedics Enhanced Recovery after Surgery is a protocol used to improve the stress on your body and your recovery after surgery.  Patient Instructions  The day of surgery (if you do NOT have diabetes):  Drink ONE (1) Pre-Surgery Clear Ensure by 4:30 am the morning of surgery   This drink was given to you during your hospital  pre-op appointment visit. Nothing else to drink after completing the  Pre-Surgery Clear Ensure.         If you have questions, please contact your surgeon's office.     Take these medicines the morning of surgery with A SIP OF WATER:  NONE  As of today, STOP taking any Aspirin (unless otherwise instructed by your surgeon) Aleve, Naproxen, Ibuprofen, Motrin, Advil, Goody's, BC's, all herbal medications, fish oil, and all vitamins.                     Do NOT Smoke (Tobacco/Vaping) for 24 hours prior to your procedure.  If you use a CPAP at night, you may bring your mask/headgear for your overnight stay.   Contacts, glasses, piercing's, hearing aid's, dentures or partials  may not be worn into surgery, please bring cases for these belongings.    For patients admitted to the hospital, discharge time will be determined by your treatment team.   Patients discharged the day of surgery will not be allowed to drive home, and someone needs to stay with them for 24 hours.  SURGICAL WAITING ROOM VISITATION Patients having surgery or a procedure may have two support people in the waiting area. Visitors may stay in the waiting area during the procedure and switch out with other visitors if needed. Children under the age of 51 must have an adult accompany them who is not the patient. If the patient needs to stay at the hospital during part of their recovery, the visitor guidelines for inpatient rooms apply.  Please refer to the [facility] website for the visitor guidelines for Inpatients (after your surgery is over and you are in a regular room).                                    - Preparing for Total Shoulder Arthroplasty   Before surgery, you can play an important role. Because skin is not sterile, your skin needs to be as free of germs as possible. You can reduce the number of germs on your skin by using the following products. Benzoyl Peroxide Gel Reduces  the number of germs present on the skin Applied twice a day to shoulder area starting two days before surgery   Chlorhexidine Gluconate (CHG) Soap An antiseptic cleaner that kills germs and bonds with the skin to continue killing germs even after washing Used for showering the night before surgery and morning of surgery   Oral Hygiene is also important to reduce your risk of infection.                                    Remember - BRUSH YOUR TEETH THE MORNING OF SURGERY WITH YOUR REGULAR TOOTHPASTE  ==================================================================  Please follow these instructions carefully:  BENZOYL PEROXIDE 5% GEL  Please do not use if you have an allergy to benzoyl peroxide.    If your skin becomes reddened/irritated stop using the benzoyl peroxide.  Starting two days before surgery, apply as follows: Apply benzoyl peroxide in the morning and at night. Apply after taking a shower. If you are not taking a shower clean entire shoulder front, back, and side along with the armpit with a clean wet washcloth.  Place a quarter-sized dollop on your shoulder and rub in thoroughly, making sure to cover the front, back, and side of your shoulder, along with the armpit.   2 days before ____ AM   ____ PM              1 day before ____ AM   ____ PM                             Do this twice a day for two days.  (Last application is the night before surgery, AFTER using the CHG soap as described below).  Do NOT apply benzoyl peroxide gel on the day of surgery.  CHLORHEXIDINE GLUCONATE (CHG) SOAP  Please do not use if you have an allergy to CHG or antibacterial soaps. If your skin becomes reddened/irritated stop using the CHG.   Do not shave (including legs and underarms) for at least 48 hours prior to first CHG shower. It is OK to shave your face.  Starting the night before surgery, use CHG soap as follows:  Shower the NIGHT BEFORE SURGERY and MORNING OF SURGERY with CHG.  If you choose to wash your hair, wash your hair first as usual with your normal shampoo.  After shampooing, rinse your hair and body thoroughly to remove the shampoo.  Use CHG as you would any other liquid soap.  You can apply CHG directly to the skin and wash gently with a scrungie or a clean washcloth.  Apply the CHG soap to your body ONLY FROM THE NECK DOWN.  Do not use on open wounds or open sores.  Avoid contact with your eyes, ears, mouth, and genitals (private parts).  Wash face and genitals (private parts) with your normal soap.  Wash thoroughly, paying special attention to the area where your surgery will be performed.  Thoroughly rinse your body with warm water from the neck down.  DO NOT  shower/wash with your normal soap after using and rinsing off the CHG soap.   Pat yourself dry with a CLEAN TOWEL.    Apply benzoyl peroxide.   Wear CLEAN PAJAMAS to bed the night before surgery; wear comfortable clothes the morning of surgery.  Place CLEAN SHEETS on your bed the night of your first shower  and DO NOT SLEEP WITH PETS.  Day of Surgery: Take a shower with CHG soap. Do not wear jewelry. Do not wear lotions, powders, perfumes/colognes, or deodorant. Do not shave 48 hours prior to surgery.  Men may shave face and neck. Do not bring valuables to the hospital.  [facility] is not responsible for any belongings or valuables. Wear Clean/Comfortable clothing the morning of surgery Do not apply any deodorants/lotions.   Remember to brush your teeth WITH YOUR REGULAR TOOTHPASTE.    Please read over the following fact sheets that you were given.  If you received a COVID test during your pre-op visit  it is requested that you wear a mask when out in public, stay away from anyone that may not be feeling well and notify your surgeon if you develop symptoms. If you have been in contact with anyone that has tested positive in the last 10 days please notify you surgeon.

## 2022-08-02 ENCOUNTER — Ambulatory Visit
Admission: RE | Admit: 2022-08-02 | Discharge: 2022-08-02 | Disposition: A | Discharge status: Home / Self Care | Payer: Commercial Managed Care - HMO | Source: Ambulatory Visit | Attending: Neurology | Admitting: Neurology

## 2022-08-02 ENCOUNTER — Ambulatory Visit
Admission: RE | Admit: 2022-08-02 | Discharge: 2022-08-02 | Disposition: A | Discharge status: Home / Self Care | Payer: Self-pay | Source: Ambulatory Visit | Attending: Neurology | Admitting: Neurology

## 2022-08-02 DIAGNOSIS — M542 Cervicalgia: Secondary | ICD-10-CM | POA: Diagnosis not present

## 2022-08-02 DIAGNOSIS — R259 Unspecified abnormal involuntary movements: Secondary | ICD-10-CM | POA: Diagnosis not present

## 2022-08-03 ENCOUNTER — Encounter (HOSPITAL_COMMUNITY): Admission: RE | Admit: 2022-08-03 | Payer: No Typology Code available for payment source | Source: Ambulatory Visit

## 2022-08-05 ENCOUNTER — Telehealth: Payer: Self-pay | Admitting: Neurology

## 2022-08-05 NOTE — Telephone Encounter (Signed)
Please call patient, MRI of cervical spine showed mild degenerative changes and there is no evidence of spinal cord compression  MRI of the brain showed mild small vessel disease, there was no significant abnormalities  None of the above findings will explain his jerking movement,    IMPRESSION: This MRI of the cervical spine without contrast shows the following: At C2-C3 and C3-C4 there are mild degenerative changes but no nerve root compression or spinal stenosis. At C4-C5, there is severe left facet hypertrophy and other degenerative changes causing mild spinal stenosis and moderate left foraminal narrowing.  There did not appear to be nerve root compression. At C5-C6, there are degenerative changes.  The central canal is narrowed but not on left may consider spinal stenosis.  There is no nerve root compression. At C6-C7: There are degenerative changes causing mild spinal stenosis and mild to moderate foraminal narrowing but no nerve root compression.    IMPRESSION: This MRI of the brain without contrast shows the following: No acute findings. Few T2/FLAIR hyperintense foci in the subcortical and deep white matter and one focus in the pons consistent with mild chronic microvascular ischemic change.  None of these appear to be acute. Ethmoid and maxillary chronic sinusitis as detailed above.

## 2022-08-05 NOTE — Telephone Encounter (Signed)
Pt verified by name and DOB,   results given per provider, pt voiced understanding.  

## 2022-08-10 ENCOUNTER — Encounter (HOSPITAL_COMMUNITY): Payer: Self-pay | Admitting: Orthopedic Surgery

## 2022-08-10 NOTE — Anesthesia Preprocedure Evaluation (Addendum)
Anesthesia Evaluation  Patient identified by MRN, date of birth, ID band Patient awake    Reviewed: Allergy & Precautions, NPO status , Patient's Chart, lab work & pertinent test results  History of Anesthesia Complications Negative for: history of anesthetic complications  Airway Mallampati: I  TM Distance: >3 FB Neck ROM: Full    Dental  (+) Dental Advisory Given, Missing, Poor Dentition   Pulmonary Patient abstained from smoking., former smoker   breath sounds clear to auscultation       Cardiovascular negative cardio ROS  Rhythm:Regular Rate:Normal     Neuro/Psych negative neurological ROS     GI/Hepatic negative GI ROS,,,(+)     substance abuse  marijuana use  Endo/Other  BMI 32.56  Renal/GU negative Renal ROS     Musculoskeletal  (+) Arthritis ,    Abdominal  (+) + obese  Peds  Hematology   Anesthesia Other Findings   Reproductive/Obstetrics                              Anesthesia Physical Anesthesia Plan  ASA: 3  Anesthesia Plan: General   Post-op Pain Management: Regional block* and Tylenol PO (pre-op)*   Induction: Intravenous  PONV Risk Score and Plan: 2 and Ondansetron and Dexamethasone  Airway Management Planned: Oral ETT  Additional Equipment: None  Intra-op Plan:   Post-operative Plan: Extubation in OR  Informed Consent: I have reviewed the patients History and Physical, chart, labs and discussed the procedure including the risks, benefits and alternatives for the proposed anesthesia with the patient or authorized representative who has indicated his/her understanding and acceptance.     Dental advisory given  Plan Discussed with: CRNA and Surgeon  Anesthesia Plan Comments: (Plan routine  monitors, GETA with interscalene block for post op analgesia)         Anesthesia Quick Evaluation

## 2022-08-10 NOTE — Progress Notes (Signed)
PCP - Dr Nolene Ebbs Cardiologist - n/a Neurology - Dr Marcial Pacas  Chest x-ray - n/a EKG - n/a Stress Test - n/a ECHO - n/a Cardiac Cath - n/a  ICD Pacemaker/Loop - n/a  Sleep Study -  n/a CPAP - none  ERAS: Clear liquids til 4:30 AM DOS.  STOP now taking any Aspirin (unless otherwise instructed by your surgeon), Aleve, Naproxen, Ibuprofen, Motrin, Advil, Goody's, BC's, all herbal medications, fish oil, and all vitamins.   Coronavirus Screening Do you have any of the following symptoms:  Cough yes/no: No Fever (>100.76F)  yes/no: No Runny nose yes/no: No Sore throat yes/no: No Difficulty breathing/shortness of breath  yes/no: No  Have you traveled in the last 14 days and where? yes/no: No  Patient verbalized understanding of instructions that were given via phone.

## 2022-08-11 ENCOUNTER — Other Ambulatory Visit: Payer: Self-pay

## 2022-08-11 ENCOUNTER — Encounter (HOSPITAL_COMMUNITY): Payer: Self-pay | Admitting: Orthopedic Surgery

## 2022-08-11 ENCOUNTER — Ambulatory Visit (HOSPITAL_COMMUNITY): Payer: Commercial Managed Care - HMO | Admitting: Anesthesiology

## 2022-08-11 ENCOUNTER — Ambulatory Visit (HOSPITAL_BASED_OUTPATIENT_CLINIC_OR_DEPARTMENT_OTHER): Payer: Commercial Managed Care - HMO | Admitting: Anesthesiology

## 2022-08-11 ENCOUNTER — Observation Stay (HOSPITAL_COMMUNITY)
Admission: RE | Admit: 2022-08-11 | Discharge: 2022-08-12 | Disposition: A | Discharge status: Home / Self Care | Payer: Commercial Managed Care - HMO | Attending: Orthopedic Surgery | Admitting: Orthopedic Surgery

## 2022-08-11 ENCOUNTER — Encounter (HOSPITAL_COMMUNITY)
Admission: RE | Disposition: A | Discharge status: Home / Self Care | Payer: Self-pay | Source: Home / Self Care | Attending: Orthopedic Surgery

## 2022-08-11 ENCOUNTER — Observation Stay (HOSPITAL_COMMUNITY): Payer: Commercial Managed Care - HMO

## 2022-08-11 DIAGNOSIS — M19011 Primary osteoarthritis, right shoulder: Secondary | ICD-10-CM

## 2022-08-11 DIAGNOSIS — Z79899 Other long term (current) drug therapy: Secondary | ICD-10-CM | POA: Diagnosis not present

## 2022-08-11 DIAGNOSIS — M7521 Bicipital tendinitis, right shoulder: Secondary | ICD-10-CM

## 2022-08-11 DIAGNOSIS — Z87891 Personal history of nicotine dependence: Secondary | ICD-10-CM

## 2022-08-11 DIAGNOSIS — E669 Obesity, unspecified: Secondary | ICD-10-CM | POA: Diagnosis not present

## 2022-08-11 DIAGNOSIS — Z96611 Presence of right artificial shoulder joint: Secondary | ICD-10-CM | POA: Diagnosis not present

## 2022-08-11 DIAGNOSIS — Z6832 Body mass index (BMI) 32.0-32.9, adult: Secondary | ICD-10-CM

## 2022-08-11 DIAGNOSIS — Z01818 Encounter for other preprocedural examination: Secondary | ICD-10-CM

## 2022-08-11 HISTORY — DX: Unspecified osteoarthritis, unspecified site: M19.90

## 2022-08-11 HISTORY — PX: REVERSE SHOULDER ARTHROPLASTY: SHX5054

## 2022-08-11 LAB — CBC
HCT: 40.8 % (ref 39.0–52.0)
Hemoglobin: 13.6 g/dL (ref 13.0–17.0)
MCH: 30.8 pg (ref 26.0–34.0)
MCHC: 33.3 g/dL (ref 30.0–36.0)
MCV: 92.5 fL (ref 80.0–100.0)
Platelets: 250 10*3/uL (ref 150–400)
RBC: 4.41 MIL/uL (ref 4.22–5.81)
RDW: 13.2 % (ref 11.5–15.5)
WBC: 6.8 10*3/uL (ref 4.0–10.5)
nRBC: 0 % (ref 0.0–0.2)

## 2022-08-11 LAB — SURGICAL PCR SCREEN
MRSA, PCR: NEGATIVE
Staphylococcus aureus: NEGATIVE

## 2022-08-11 LAB — BASIC METABOLIC PANEL
Anion gap: 14 (ref 5–15)
BUN: 13 mg/dL (ref 6–20)
CO2: 21 mmol/L — ABNORMAL LOW (ref 22–32)
Calcium: 9.1 mg/dL (ref 8.9–10.3)
Chloride: 107 mmol/L (ref 98–111)
Creatinine, Ser: 0.95 mg/dL (ref 0.61–1.24)
GFR, Estimated: >60 mL/min (ref >60)
Glucose, Bld: 126 mg/dL — ABNORMAL HIGH (ref 70–99)
Potassium: 4.2 mmol/L (ref 3.5–5.1)
Sodium: 142 mmol/L (ref 135–145)

## 2022-08-11 SURGERY — ARTHROPLASTY, SHOULDER, TOTAL, REVERSE
Anesthesia: General | Site: Shoulder | Laterality: Right

## 2022-08-11 MED ORDER — ROCURONIUM BROMIDE 10 MG/ML (PF) SYRINGE
PREFILLED_SYRINGE | INTRAVENOUS | Status: AC
  Filled 2022-08-11: qty 10

## 2022-08-11 MED ORDER — TRANEXAMIC ACID-NACL 1000-0.7 MG/100ML-% IV SOLN
1000.0000 mg | INTRAVENOUS | Status: AC
  Administered 2022-08-11: 1000 mg via INTRAVENOUS
  Filled 2022-08-11: qty 100

## 2022-08-11 MED ORDER — CEFAZOLIN SODIUM-DEXTROSE 2-4 GM/100ML-% IV SOLN
2.0000 g | INTRAVENOUS | Status: AC
  Administered 2022-08-11: 2 g via INTRAVENOUS
  Filled 2022-08-11: qty 100

## 2022-08-11 MED ORDER — PHENYLEPHRINE HCL-NACL 20-0.9 MG/250ML-% IV SOLN
INTRAVENOUS | Status: DC | PRN
  Administered 2022-08-11: 25 ug/min via INTRAVENOUS

## 2022-08-11 MED ORDER — SODIUM CHLORIDE 0.9 % IV SOLN: INTRAVENOUS | Status: DC

## 2022-08-11 MED ORDER — DEXAMETHASONE SODIUM PHOSPHATE 10 MG/ML IJ SOLN
INTRAMUSCULAR | Status: DC | PRN
  Administered 2022-08-11: 8 mg via INTRAVENOUS

## 2022-08-11 MED ORDER — ONDANSETRON HCL 4 MG PO TABS: 4.0000 mg | ORAL_TABLET | Freq: Four times a day (QID) | ORAL | Status: DC | PRN

## 2022-08-11 MED ORDER — IRRISEPT - 450ML BOTTLE WITH 0.05% CHG IN STERILE WATER, USP 99.95% OPTIME
TOPICAL | Status: DC | PRN
  Administered 2022-08-11: 450 mL

## 2022-08-11 MED ORDER — ONDANSETRON HCL 4 MG/2ML IJ SOLN
INTRAMUSCULAR | Status: AC
  Filled 2022-08-11: qty 2

## 2022-08-11 MED ORDER — PROPOFOL 10 MG/ML IV BOLUS
INTRAVENOUS | Status: AC
  Filled 2022-08-11: qty 20

## 2022-08-11 MED ORDER — BUPIVACAINE LIPOSOME 1.3 % IJ SUSP
INTRAMUSCULAR | Status: DC | PRN
  Administered 2022-08-11: 10 mL via PERINEURAL

## 2022-08-11 MED ORDER — DEXAMETHASONE SODIUM PHOSPHATE 10 MG/ML IJ SOLN
INTRAMUSCULAR | Status: AC
  Filled 2022-08-11: qty 1

## 2022-08-11 MED ORDER — LIDOCAINE 2% (20 MG/ML) 5 ML SYRINGE
INTRAMUSCULAR | Status: DC | PRN
  Administered 2022-08-11: 60 mg via INTRAVENOUS

## 2022-08-11 MED ORDER — FENTANYL CITRATE (PF) 250 MCG/5ML IJ SOLN
INTRAMUSCULAR | Status: AC
  Filled 2022-08-11: qty 5

## 2022-08-11 MED ORDER — ROCURONIUM BROMIDE 10 MG/ML (PF) SYRINGE
PREFILLED_SYRINGE | INTRAVENOUS | Status: DC | PRN
  Administered 2022-08-11: 70 mg via INTRAVENOUS

## 2022-08-11 MED ORDER — METHOCARBAMOL 500 MG PO TABS
500.0000 mg | ORAL_TABLET | Freq: Four times a day (QID) | ORAL | Status: DC | PRN
  Administered 2022-08-12: 500 mg via ORAL
  Filled 2022-08-11: qty 1

## 2022-08-11 MED ORDER — ACETAMINOPHEN 500 MG PO TABS
1000.0000 mg | ORAL_TABLET | Freq: Once | ORAL | Status: AC
  Administered 2022-08-11: 1000 mg via ORAL
  Filled 2022-08-11: qty 2

## 2022-08-11 MED ORDER — PHENYLEPHRINE 80 MCG/ML (10ML) SYRINGE FOR IV PUSH (FOR BLOOD PRESSURE SUPPORT)
PREFILLED_SYRINGE | INTRAVENOUS | Status: DC | PRN
  Administered 2022-08-11 (×2): 80 ug via INTRAVENOUS

## 2022-08-11 MED ORDER — HYDROMORPHONE HCL 1 MG/ML IJ SOLN
INTRAMUSCULAR | Status: AC
  Filled 2022-08-11: qty 2

## 2022-08-11 MED ORDER — CHLORHEXIDINE GLUCONATE 0.12 % MT SOLN
OROMUCOSAL | Status: AC
  Administered 2022-08-11: 15 mL
  Filled 2022-08-11: qty 15

## 2022-08-11 MED ORDER — MIDAZOLAM HCL 2 MG/2ML IJ SOLN
INTRAMUSCULAR | Status: AC
  Filled 2022-08-11: qty 2

## 2022-08-11 MED ORDER — OXYCODONE HCL 5 MG PO TABS
5.0000 mg | ORAL_TABLET | Freq: Once | ORAL | Status: AC | PRN
  Administered 2022-08-11: 5 mg via ORAL

## 2022-08-11 MED ORDER — MIDAZOLAM HCL 2 MG/2ML IJ SOLN
INTRAMUSCULAR | Status: DC | PRN
  Administered 2022-08-11: 2 mg via INTRAVENOUS

## 2022-08-11 MED ORDER — PROPOFOL 10 MG/ML IV BOLUS
INTRAVENOUS | Status: DC | PRN
  Administered 2022-08-11: 200 mg via INTRAVENOUS

## 2022-08-11 MED ORDER — HYDROMORPHONE HCL 1 MG/ML IJ SOLN
0.5000 mg | INTRAMUSCULAR | Status: DC | PRN
  Administered 2022-08-12: 0.5 mg via INTRAVENOUS
  Filled 2022-08-11: qty 0.5

## 2022-08-11 MED ORDER — PHENYLEPHRINE 80 MCG/ML (10ML) SYRINGE FOR IV PUSH (FOR BLOOD PRESSURE SUPPORT)
PREFILLED_SYRINGE | INTRAVENOUS | Status: AC
  Filled 2022-08-11: qty 10

## 2022-08-11 MED ORDER — ONDANSETRON HCL 4 MG/2ML IJ SOLN
INTRAMUSCULAR | Status: DC | PRN
  Administered 2022-08-11 (×2): 4 mg via INTRAVENOUS

## 2022-08-11 MED ORDER — PHENYLEPHRINE HCL (PRESSORS) 10 MG/ML IV SOLN
INTRAVENOUS | Status: AC
  Filled 2022-08-11: qty 1

## 2022-08-11 MED ORDER — MIDAZOLAM HCL 2 MG/2ML IJ SOLN
0.5000 mg | Freq: Once | INTRAMUSCULAR | Status: AC | PRN
  Administered 2022-08-11: 1 mg via INTRAVENOUS

## 2022-08-11 MED ORDER — BUPIVACAINE-EPINEPHRINE (PF) 0.5% -1:200000 IJ SOLN
INTRAMUSCULAR | Status: DC | PRN
  Administered 2022-08-11: 15 mL via PERINEURAL

## 2022-08-11 MED ORDER — OXYCODONE HCL 5 MG PO TABS
ORAL_TABLET | ORAL | Status: AC
  Filled 2022-08-11: qty 1

## 2022-08-11 MED ORDER — LACTATED RINGERS IV SOLN: INTRAVENOUS | Status: DC | PRN

## 2022-08-11 MED ORDER — MEPERIDINE HCL 25 MG/ML IJ SOLN: 6.2500 mg | INTRAMUSCULAR | Status: DC | PRN

## 2022-08-11 MED ORDER — VANCOMYCIN HCL 1000 MG IV SOLR
INTRAVENOUS | Status: AC
  Filled 2022-08-11: qty 20

## 2022-08-11 MED ORDER — POVIDONE-IODINE 10 % EX SWAB
2.0000 | Freq: Once | CUTANEOUS | Status: AC
  Administered 2022-08-11: 2 via TOPICAL

## 2022-08-11 MED ORDER — HYDROMORPHONE HCL 1 MG/ML IJ SOLN
0.2500 mg | INTRAMUSCULAR | Status: DC | PRN
  Administered 2022-08-11 (×4): 0.5 mg via INTRAVENOUS

## 2022-08-11 MED ORDER — CEFAZOLIN SODIUM-DEXTROSE 2-4 GM/100ML-% IV SOLN
2.0000 g | Freq: Three times a day (TID) | INTRAVENOUS | Status: DC
  Administered 2022-08-11 – 2022-08-12 (×2): 2 g via INTRAVENOUS
  Filled 2022-08-11 (×2): qty 100

## 2022-08-11 MED ORDER — PROMETHAZINE HCL 25 MG/ML IJ SOLN: 6.2500 mg | INTRAMUSCULAR | Status: DC | PRN

## 2022-08-11 MED ORDER — DOCUSATE SODIUM 100 MG PO CAPS
100.0000 mg | ORAL_CAPSULE | Freq: Two times a day (BID) | ORAL | Status: DC
  Administered 2022-08-11 – 2022-08-12 (×2): 100 mg via ORAL
  Filled 2022-08-11 (×2): qty 1

## 2022-08-11 MED ORDER — MENTHOL 3 MG MT LOZG: 1.0000 | LOZENGE | OROMUCOSAL | Status: DC | PRN

## 2022-08-11 MED ORDER — METOCLOPRAMIDE HCL 5 MG PO TABS: 5.0000 mg | ORAL_TABLET | Freq: Three times a day (TID) | ORAL | Status: DC | PRN

## 2022-08-11 MED ORDER — LIDOCAINE 2% (20 MG/ML) 5 ML SYRINGE
INTRAMUSCULAR | Status: AC
  Filled 2022-08-11: qty 5

## 2022-08-11 MED ORDER — ACETAMINOPHEN 500 MG PO TABS
1000.0000 mg | ORAL_TABLET | Freq: Four times a day (QID) | ORAL | Status: DC
  Administered 2022-08-12 (×2): 1000 mg via ORAL
  Filled 2022-08-11 (×2): qty 2

## 2022-08-11 MED ORDER — OXYCODONE HCL 5 MG PO TABS
5.0000 mg | ORAL_TABLET | ORAL | Status: DC | PRN
  Administered 2022-08-11 – 2022-08-12 (×3): 10 mg via ORAL
  Filled 2022-08-11 (×3): qty 2

## 2022-08-11 MED ORDER — 0.9 % SODIUM CHLORIDE (POUR BTL) OPTIME
TOPICAL | Status: DC | PRN
  Administered 2022-08-11 (×5): 1000 mL

## 2022-08-11 MED ORDER — METOCLOPRAMIDE HCL 5 MG/ML IJ SOLN: 5.0000 mg | Freq: Three times a day (TID) | INTRAMUSCULAR | Status: DC | PRN

## 2022-08-11 MED ORDER — PHENOL 1.4 % MT LIQD: 1.0000 | OROMUCOSAL | Status: DC | PRN

## 2022-08-11 MED ORDER — FENTANYL CITRATE (PF) 100 MCG/2ML IJ SOLN
INTRAMUSCULAR | Status: DC | PRN
  Administered 2022-08-11 (×3): 50 ug via INTRAVENOUS

## 2022-08-11 MED ORDER — METHOCARBAMOL 1000 MG/10ML IJ SOLN: 500.0000 mg | Freq: Four times a day (QID) | INTRAVENOUS | Status: DC | PRN

## 2022-08-11 MED ORDER — ACETAMINOPHEN 325 MG PO TABS
325.0000 mg | ORAL_TABLET | Freq: Four times a day (QID) | ORAL | Status: DC | PRN
  Administered 2022-08-12: 650 mg via ORAL
  Filled 2022-08-11: qty 2

## 2022-08-11 MED ORDER — ASPIRIN 81 MG PO TBEC
81.0000 mg | DELAYED_RELEASE_TABLET | Freq: Every day | ORAL | Status: DC
  Administered 2022-08-12: 81 mg via ORAL
  Filled 2022-08-11: qty 1

## 2022-08-11 MED ORDER — POVIDONE-IODINE 7.5 % EX SOLN
Freq: Once | CUTANEOUS | Status: DC
  Filled 2022-08-11: qty 118

## 2022-08-11 MED ORDER — SUGAMMADEX SODIUM 200 MG/2ML IV SOLN
INTRAVENOUS | Status: DC | PRN
  Administered 2022-08-11: 210 mg via INTRAVENOUS

## 2022-08-11 MED ORDER — ONDANSETRON HCL 4 MG/2ML IJ SOLN: 4.0000 mg | Freq: Four times a day (QID) | INTRAMUSCULAR | Status: DC | PRN

## 2022-08-11 MED ORDER — VANCOMYCIN HCL 1000 MG IV SOLR
INTRAVENOUS | Status: DC | PRN
  Administered 2022-08-11: 1000 mg via TOPICAL

## 2022-08-11 MED ORDER — OXYCODONE HCL 5 MG/5ML PO SOLN: 5.0000 mg | Freq: Once | ORAL | Status: AC | PRN

## 2022-08-11 SURGICAL SUPPLY — 81 items
ALCOHOL 70% 16 OZ (MISCELLANEOUS) ×1 IMPLANT
BAG COUNTER SPONGE SURGICOUNT (BAG) ×1 IMPLANT
BEARING HUMERAL 40 STD VITE (Joint) IMPLANT
BIT DRILL 2.7 W/STOP DISP (BIT) IMPLANT
BIT DRILL QUICK REL 1/8 2PK SL (DRILL) IMPLANT
BIT DRILL TWIST 2.7 (BIT) IMPLANT
BLADE SAW SGTL 13X75X1.27 (BLADE) ×1 IMPLANT
CHLORAPREP W/TINT 26 (MISCELLANEOUS) ×1 IMPLANT
COMP REV AUG LG W/TAPER/GLENOI (Joint) ×1 IMPLANT
COMPONENT RV AUG LG W/TAPR/GLN (Joint) IMPLANT
COOLER ICEMAN CLASSIC (MISCELLANEOUS) ×1 IMPLANT
COVER SURGICAL LIGHT HANDLE (MISCELLANEOUS) ×1 IMPLANT
DRAPE INCISE IOBAN 66X45 STRL (DRAPES) ×1 IMPLANT
DRAPE U-SHAPE 47X51 STRL (DRAPES) ×2 IMPLANT
DRILL QUICK RELEASE 1/8 INCH (DRILL) ×1
DRSG AQUACEL AG ADV 3.5X10 (GAUZE/BANDAGES/DRESSINGS) ×1 IMPLANT
ELECT BLADE 4.0 EZ CLEAN MEGAD (MISCELLANEOUS) ×2
ELECT REM PT RETURN 9FT ADLT (ELECTROSURGICAL) ×1
ELECTRODE BLDE 4.0 EZ CLN MEGD (MISCELLANEOUS) ×1 IMPLANT
ELECTRODE REM PT RTRN 9FT ADLT (ELECTROSURGICAL) ×1 IMPLANT
GAUZE SPONGE 4X4 12PLY STRL LF (GAUZE/BANDAGES/DRESSINGS) ×1 IMPLANT
GLENOSPHERE VERSADIAL 40 +3 (Joint) IMPLANT
GLOVE BIOGEL PI IND STRL 7.0 (GLOVE) ×1 IMPLANT
GLOVE BIOGEL PI IND STRL 8 (GLOVE) ×1 IMPLANT
GLOVE ECLIPSE 7.0 STRL STRAW (GLOVE) ×1 IMPLANT
GLOVE ECLIPSE 8.0 STRL XLNG CF (GLOVE) ×1 IMPLANT
GOWN STRL REUS W/ TWL LRG LVL3 (GOWN DISPOSABLE) ×1 IMPLANT
GOWN STRL REUS W/ TWL XL LVL3 (GOWN DISPOSABLE) ×1 IMPLANT
GOWN STRL REUS W/TWL LRG LVL3 (GOWN DISPOSABLE) ×1
GOWN STRL REUS W/TWL XL LVL3 (GOWN DISPOSABLE) ×1
GUIDE MODEL REV SHLD RT (ORTHOPEDIC DISPOSABLE SUPPLIES) IMPLANT
HYDROGEN PEROXIDE 16OZ (MISCELLANEOUS) ×1 IMPLANT
JET LAVAGE IRRISEPT WOUND (IRRIGATION / IRRIGATOR) ×1
KIT BASIN OR (CUSTOM PROCEDURE TRAY) ×1 IMPLANT
KIT TURNOVER KIT B (KITS) ×1 IMPLANT
LAVAGE JET IRRISEPT WOUND (IRRIGATION / IRRIGATOR) ×1 IMPLANT
LOOP VESSEL MAXI BLUE (MISCELLANEOUS) ×1 IMPLANT
MANIFOLD NEPTUNE II (INSTRUMENTS) ×1 IMPLANT
NDL SUT 6 .5 CRC .975X.05 MAYO (NEEDLE) IMPLANT
NDL TAPERED W/ NITINOL LOOP (MISCELLANEOUS) ×1 IMPLANT
NEEDLE MAYO TAPER (NEEDLE)
NEEDLE TAPERED W/ NITINOL LOOP (MISCELLANEOUS) ×1 IMPLANT
NS IRRIG 1000ML POUR BTL (IV SOLUTION) ×1 IMPLANT
PACK SHOULDER (CUSTOM PROCEDURE TRAY) ×1 IMPLANT
PAD ARMBOARD 7.5X6 YLW CONV (MISCELLANEOUS) ×2 IMPLANT
PAD COLD SHLDR WRAP-ON (PAD) ×1 IMPLANT
PASSER SUT SWANSON 36MM LOOP (INSTRUMENTS) ×1 IMPLANT
PIN THREADED REVERSE (PIN) IMPLANT
REAMER GUIDE BUSHING SURG DISP (MISCELLANEOUS) IMPLANT
REAMER GUIDE W/SCREW AUG (MISCELLANEOUS) IMPLANT
RESTRAINT HEAD UNIVERSAL NS (MISCELLANEOUS) ×1 IMPLANT
SCREW BONE LOCKING 4.75X30X3.5 (Screw) IMPLANT
SCREW BONE STRL 6.5MMX25MM (Screw) IMPLANT
SCREW LOCKING 4.75MMX15MM (Screw) IMPLANT
SCREW LOCKING NS 4.75MMX20MM (Screw) IMPLANT
SLING ARM IMMOBILIZER LRG (SOFTGOODS) ×1 IMPLANT
SLING ARM IMMOBILIZER MED (SOFTGOODS) IMPLANT
SLING ARM IMMOBILIZER XL (CAST SUPPLIES) IMPLANT
SOL PREP POV-IOD 4OZ 10% (MISCELLANEOUS) ×1 IMPLANT
SPONGE T-LAP 18X18 ~~LOC~~+RFID (SPONGE) ×1 IMPLANT
STEM HUMERAL STRL 11MMX55MM (Stem) IMPLANT
STRIP CLOSURE SKIN 1/2X4 (GAUZE/BANDAGES/DRESSINGS) ×1 IMPLANT
SUCTION FRAZIER HANDLE 10FR (MISCELLANEOUS) ×1
SUCTION TUBE FRAZIER 10FR DISP (MISCELLANEOUS) ×1 IMPLANT
SUT FIBERWIRE #2 38 T-5 BLUE (SUTURE) ×1
SUT MAXBRAID (SUTURE) IMPLANT
SUT MNCRL AB 3-0 PS2 18 (SUTURE) ×1 IMPLANT
SUT SILK 2 0 TIES 10X30 (SUTURE) ×1 IMPLANT
SUT VIC AB 0 CT1 27 (SUTURE) ×2
SUT VIC AB 0 CT1 27XBRD ANBCTR (SUTURE) ×4 IMPLANT
SUT VIC AB 1 CT1 27 (SUTURE) ×4
SUT VIC AB 1 CT1 27XBRD ANBCTR (SUTURE) ×2 IMPLANT
SUT VIC AB 2-0 CT1 27 (SUTURE) ×3
SUT VIC AB 2-0 CT1 TAPERPNT 27 (SUTURE) ×3 IMPLANT
SUT VICRYL 0 UR6 27IN ABS (SUTURE) ×2 IMPLANT
SUTURE FIBERWR #2 38 T-5 BLUE (SUTURE) IMPLANT
TOWEL GREEN STERILE (TOWEL DISPOSABLE) ×1 IMPLANT
TRAY FOL W/BAG SLVR 16FR STRL (SET/KITS/TRAYS/PACK) IMPLANT
TRAY FOLEY W/BAG SLVR 16FR LF (SET/KITS/TRAYS/PACK) ×1
TRAY HUM REV SHOULDER STD +6 (Shoulder) IMPLANT
WATER STERILE IRR 1000ML POUR (IV SOLUTION) ×1 IMPLANT

## 2022-08-11 NOTE — Transfer of Care (Signed)
Immediate Anesthesia Transfer of Care Note  Patient: Brian Cain  Procedure(s) Performed: RIGHT REVERSE SHOULDER ARTHROPLASTY (Right: Shoulder)  Patient Location: PACU  Anesthesia Type:General  Level of Consciousness: drowsy  Airway & Oxygen Therapy: Patient Spontanous Breathing and Patient connected to face mask oxygen  Post-op Assessment: Report given to RN and Post -op Vital signs reviewed and stable  Post vital signs: Reviewed and stable  Last Vitals:  Vitals Value Taken Time  BP 146/86 08/11/22 1226  Temp    Pulse 79 08/11/22 1228  Resp 28 08/11/22 1228  SpO2 95 % 08/11/22 1228  Vitals shown include unvalidated device data.  Last Pain:  Vitals:   08/11/22 0623  TempSrc: Oral  PainSc: 0-No pain         Complications: No notable events documented.

## 2022-08-11 NOTE — Anesthesia Postprocedure Evaluation (Signed)
Anesthesia Post Note  Patient: Brian Cain  Procedure(s) Performed: RIGHT REVERSE SHOULDER ARTHROPLASTY (Right: Shoulder)     Patient location during evaluation: PACU Anesthesia Type: General Level of consciousness: awake and alert, patient cooperative and oriented Pain management: pain level controlled Vital Signs Assessment: post-procedure vital signs reviewed and stable Respiratory status: spontaneous breathing, nonlabored ventilation and respiratory function stable Cardiovascular status: blood pressure returned to baseline and stable Postop Assessment: no apparent nausea or vomiting and adequate PO intake Anesthetic complications: no   No notable events documented.  Last Vitals:  Vitals:   08/11/22 1400 08/11/22 1430  BP: 134/86 133/86  Pulse: 70 60  Resp: 16 15  Temp: 36.8 C   SpO2: 96% 97%    Last Pain:  Vitals:   08/11/22 1430  TempSrc:   PainSc: Asleep                 Chick Cousins,E. Abygail Galeno

## 2022-08-11 NOTE — Brief Op Note (Signed)
   08/11/2022  12:54 PM  PATIENT:  Brian Cain  56 y.o. male  PRE-OPERATIVE DIAGNOSIS:  RIGHT SHOULDER ROTATOR CUFF ARTHROPATHY, biceps tendonitis  POST-OPERATIVE DIAGNOSIS:  RIGHT SHOULDER ROTATOR CUFF ARTHROPATHY,biceps tendinopathy   PROCEDURE:  Procedure(s): RIGHT REVERSE SHOULDER ARTHROPLASTY, biceps tenodesis  SURGEON:  Surgeon(s): Meredith Pel, MD  ASSISTANT: magnant pa  ANESTHESIA:   general  EBL: 100 ml    Total I/O In: 1500 [I.V.:1300; IV Piggyback:200] Out: 650 [Urine:150; Blood:500]  BLOOD ADMINISTERED: none  DRAINS: none   LOCAL MEDICATIONS USED:  vancomycin  SPECIMEN:  No Specimen  COUNTS:  YES  TOURNIQUET:  * No tourniquets in log *  DICTATION: .Other Dictation: Dictation Number [id number]  PLAN OF CARE: Admit for overnight observation  PATIENT DISPOSITION:  PACU - hemodynamically stable

## 2022-08-11 NOTE — Op Note (Unsigned)
NAMEJOEDY, EICKHOFF MEDICAL RECORD NO: [id number] ACCOUNT NO: [id number] DATE OF BIRTH: [birth year] FACILITY: [facility] LOCATION: [facility] PHYSICIAN: Yetta Barre. 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  PROCEDURE:  Right shoulder reverse shoulder replacement with biceps tenodesis using comprehensive reverse large augmented baseplate with central screw compression and 4 locking screws with glenosphere 40 mm +3 lateral offset and primary micro stem 11 x  55 with comprehensive mini humeral tray +6 taper offset, 40 mm in diameter with 40 mm standard bearing.  SURGEON:  Yetta Barre. Marlou Sa, MD  ASSISTANT:  Annie Main, PA.  INDICATIONS:  This is a 56 year old patient with end-stage right shoulder arthritis, who presents for operative management after explanation of risks and benefits.  DESCRIPTION OF PROCEDURE:  The patient was brought to the operating room where general endotracheal anesthesia was induced.  Preoperative antibiotics administered.  Timeout was called.  TXA was administered as well.  The patient was placed in the beach  chair position with the head in neutral position.  Right arm, shoulder and hand prescrubbed with hydrogen peroxide followed by alcohol and then Betadine, which was allowed to air dry, then prepped with ChloraPrep solution and draped in sterile manner.   Ioban used to seal the operative field as well as cover the axilla and then cover the entire operative field.  After calling timeout, deltopectoral approach was made.  Skin and subcutaneous tissue were sharply divided.  IrriSept solution utilized. The  patient did not have a cephalic vein.  Crossing veins were ligated.  Deltopectoral interval was opened.  The pectoralis tendon was released about 2 cm from its attachment  site.  The biceps tendon was then tenodesed to the pec tendon as well as  surrounding soft tissues using 6-0 Vicryl sutures.  Remaining tendon was then lifted up out of the groove and used to enter the rotator interval down to the base of the glenoid.  Subdeltoid and subacromial adhesions were released.  Anterior portion of  the deltoid released as a sleeve manually off of the anterior humerus.  Circumflex vessels were identified and ligated.  Axillary nerve was visualized, palpated and protected at all times during the case.  Next subscap was detached using a 15 blade.   Subperiosteal dissection was then carried down about 2 cm on the humeral neck and with progressive external rotation the capsular detachment was performed from the bone using cautery as well as a Cobb and soft tissue dissection, Osteophytes were removed  off of the inferior humeral neck.  These were large osteophytes.  Care was taken to avoid injury to surrounding neurovascular structures.  Osteophytes also removed from the posterior humerus.  This was a fairly painstaking process due to the exposure  issues.  Supraspinatus, which had significant tendinitis was released to facilitate visualization.  Humeral head at this time was dislocated.  A reaming was performed and then broaching was performed up to a size 11. After reaming the head was cut and  then recut later about 2-3 more millimeters.  Clide Dales was placed and cap was placed.  Bone quality was excellent.  Attention then directed towards the glenoid.  The patient had very large osteophytes posterior anterior and inferior of the glenoid.  One of  the anterior inferior osteophytes on which patient-specific instrumentation was loose, but still intact.  Guide pin was placed,  but we moved it about 2 mm more anterior due to that osteophyte instability.  The osteophyte was removed and the guide was  placed.  Reaming was performed with standard reaming as well as reaming for the large augment.   The baseplate was then placed with excellent contact achieved.  One central compression screw and four peripheral locking screws were placed, also with very  good quality bone capture.  Next, a different trials were placed.  40 mm standard glenosphere as well as a 40 mm +3 glenosphere was placed with the +6 offset humeral tray and standard bearing.  The +3 offset gave very good stability.  A trial glenosphere  was removed.  True glenosphere was placed.  The size 11 was then placed.  Subscap release was performed was not attachable to the humerus.  The reduction with the true stem, which was placed into the canal after irrigation with IrriSept solution as well  as placement of vancomycin powder was very stable.  The true components were placed.  It should be noted that the head was cut in 30 degrees of retroversion, which was the appropriate amount of retroversion.  The shoulder was difficult to reduce and  difficult to dislocate.  The patient had excellent stability to extension and abduction with a forward force and had very good motion with internal and external rotation.  A thorough irrigation was then performed.  Axillary nerve again palpated and found  to be intact.  Irrigation performed with about 4 liters of irrigating solution.  IrriSept solution then placed and then vancomycin powder placed on the prosthesis.  Deltopectoral interval was then closed using #1 Vicryl suture followed by interrupted  inverted 0 Vicryl suture, 2-0 Vicryl suture, and 3-0 Monocryl with Steri-Strips and Aquacel dressing applied.  The patient tolerated the procedure well without immediate complication.  Luke's assistance was required at all times for retraction, opening,  closing, mobilization of tissue.  His assistance was a medical necessity.   PUS D: 08/11/2022 1:02:31 pm T: 08/11/2022 3:38:00 pm  JOB: [id number]/ [id number]

## 2022-08-11 NOTE — H&P (Signed)
Brian Cain is an 56 y.o. male.   Chief Complaint: right shoulder pain HPI:  Brian Cain is a 56 year old patient with right shoulder pain.  Had a fall last year during the fall season and initially it was not painful but it did become painful.  He is right-hand dominant.  Has pain which wakes him from sleep at night.  Reports shoulder feeling stiff in the morning.  Reports decreased range of motion with some popping.  He works as a Administrator.  Does smoke marijuana for pain relief.  Having increasing tics and unusual type behavior which the wife is concerned may be Tourette's syndrome or something similar.  CT scan of the right shoulder does show severe osteoarthritis of the glenohumeral joint with significant deformity as well as bone spurring and osteophytosis.  Significant inferior osteophyte formation is present.  Multiple intra-articular loose bodies in the axillary recess.  Past Medical History:  Diagnosis Date   Arthritis    shoulder    Past Surgical History:  Procedure Laterality Date   CLOSED REDUCTION MANDIBLE WITH MANDIBULOMA N/A 02/16/2014   Procedure: CLOSED REDUCTION MANDIBLE WITH MANDIBULOMAXILLARY FUSION;  Surgeon: Izora Gala, MD;  Location: [place];  Service: ENT;  Laterality: N/A;   EXTERNAL FIXATOR AND ARCH BAR REMOVAL Bilateral 02/16/2014   Procedure:  ARCH BARS PLACEMENT;  Surgeon: Izora Gala, MD;  Location: [place];  Service: ENT;  Laterality: Bilateral;   I & D EXTREMITY  08/22/2011   Procedure: HCWCBJSEGB AND DEBRIDEMENT EXTREMITY;  Surgeon: Tennis Must;  Location: [place];  Service: Orthopedics;  Laterality: Right;   MANDIBULAR HARDWARE REMOVAL N/A 04/09/2014   Procedure: MANDIBULAR HARDWARE REMOVAL;  Surgeon: Izora Gala, MD;  Location: [employer];  Service: ENT;  Laterality: N/A;   PATELLAR TENDON REPAIR Left 02/06/2021   Procedure: LEFT PATELLA TENDON PRIMARY REPAIR;  Surgeon: Mcarthur Rossetti, MD;  Location: [place];  Service:  Orthopedics;  Laterality: Left;  block   WRIST SURGERY      Family History  Problem Relation Age of Onset   Cancer Mother        breast   Social History:  reports that he quit smoking about 10 years ago. His smoking use included cigarettes. He has a 0.90 pack-year smoking history. He has never used smokeless tobacco. He reports that he does not currently use alcohol. He reports current drug use. Drug: Marijuana.  Allergies:  Allergies  Allergen Reactions   Bee Venom Swelling    Medications Prior to Admission  Medication Sig Dispense Refill   Menthol, Topical Analgesic, (ICY HOT EX) Apply 1 Application topically daily as needed (pain).     Multiple Vitamin (MULTIVITAMIN) tablet Take 1 tablet by mouth daily.     ALPRAZolam (XANAX) 1 MG tablet Take 1-2 tablets 30 minutes prior to MRI, may repeat once as needed. Must have driver. (Patient not taking: Reported on 07/28/2022) 3 tablet 0    No results found for this or any previous visit (from the past 48 hour(s)). No results found.  Review of Systems  Musculoskeletal:  Positive for arthralgias.  All other systems reviewed and are negative.   Blood pressure (!) 167/91, pulse 68, temperature 97.7 F (36.5 C), temperature source Oral, resp. rate 20, height 5\' 11"  (1.803 m), weight 105.9 kg, SpO2 98 %. Physical Exam Vitals reviewed.  HENT:     Head: Normocephalic.     Nose: Nose normal.     Mouth/Throat:  Mouth: Mucous membranes are moist.  Eyes:     Pupils: Pupils are equal, round, and reactive to light.  Cardiovascular:     Rate and Rhythm: Normal rate.     Pulses: Normal pulses.  Pulmonary:     Effort: Pulmonary effort is normal.  Abdominal:     General: Abdomen is flat.  Musculoskeletal:     Cervical back: Normal range of motion.  Skin:    General: Skin is warm.     Capillary Refill: Capillary refill takes less than 2 seconds.  Neurological:     Mental Status: He is alert and oriented to person, place, and time.   Psychiatric:        Mood and Affect: Mood normal.    Ortho exam demonstrates shoulder range of motion on the right of 0/30/90.  On the left passive motion is 70/110/140.  Rotator cuff strength pretty reasonable but the motion is so limited it is hard to fully assess.  Deltoid is functional.  Skin intact in the shoulder girdle region.  Assessment/Plan Impression is right shoulder arthritis.  Plan is right shoulder replacement which would likely be reverse replacement.  The risk and benefits of surgery are discussed with [place] and his wife including not limited to infection or vessel damage instability incomplete pain relief as well as incomplete restoration of function.  The limitations of both reverse shoulder replacement and total shoulder replacement are discussed.  Patient understands risk benefits and wishes to proceed   Burnard Bunting, MD 08/11/2022, 6:32 AM

## 2022-08-11 NOTE — Anesthesia Procedure Notes (Signed)
Procedure Name: Intubation Date/Time: 08/11/2022 7:40 AM  Performed by: Niel Hummer, CRNAPre-anesthesia Checklist: Patient identified, Emergency Drugs available, Suction available and Patient being monitored Patient Re-evaluated:Patient Re-evaluated prior to induction Oxygen Delivery Method: Circle system utilized Preoxygenation: Pre-oxygenation with 100% oxygen Induction Type: IV induction Ventilation: Mask ventilation without difficulty Laryngoscope Size: Mac and 4 Grade View: Grade I Tube type: Oral Tube size: 7.5 mm Number of attempts: 1 Airway Equipment and Method: Stylet Placement Confirmation: ETT inserted through vocal cords under direct vision, positive ETCO2 and breath sounds checked- equal and bilateral Secured at: 24 cm Tube secured with: Tape Dental Injury: Teeth and Oropharynx as per pre-operative assessment

## 2022-08-11 NOTE — Anesthesia Procedure Notes (Signed)
Anesthesia Regional Block: Interscalene brachial plexus block   Pre-Anesthetic Checklist: , timeout performed,  Correct Patient, Correct Site, Correct Laterality,  Correct Procedure, Correct Position, site marked,  Risks and benefits discussed,  Surgical consent,  Pre-op evaluation,  At surgeon's request and post-op pain management  Laterality: Right and Upper  Prep: chloraprep       Needles:  Injection technique: Single-shot  Needle Type: Echogenic Needle     Needle Length: 9cm  Needle Gauge: 21     Additional Needles:   Procedures:,,,, ultrasound used (permanent image in chart),,    Narrative:  Start time: 08/11/2022 6:49 AM End time: 08/11/2022 6:56 AM Injection made incrementally with aspirations every 5 mL.  Performed by: Personally  Anesthesiologist: Annye Asa, MD  Additional Notes: Pt identified in Holding room.  Monitors applied. Working IV access confirmed. Sterile prep R clavicle and neck.  #21ga ECHOgenic Arrow block needle to supraclavicular brachial plexus with US guidance.  15cc 0.5% Bupivacaine 1:200k epi, Exparel injected incrementally after negative test dose.  Patient asymptomatic, VSS, no heme aspirated, tolerated well.   Jenita Seashore, MD

## 2022-08-12 ENCOUNTER — Encounter (HOSPITAL_COMMUNITY): Payer: Self-pay | Admitting: Orthopedic Surgery

## 2022-08-12 DIAGNOSIS — M19011 Primary osteoarthritis, right shoulder: Secondary | ICD-10-CM | POA: Diagnosis not present

## 2022-08-12 MED ORDER — ASPIRIN 81 MG PO TBEC: 81.0000 mg | DELAYED_RELEASE_TABLET | Freq: Every day | ORAL | 12 refills | Status: AC

## 2022-08-12 MED ORDER — ACETAMINOPHEN 325 MG PO TABS: 650.0000 mg | ORAL_TABLET | Freq: Four times a day (QID) | ORAL | 0 refills | Status: AC | PRN

## 2022-08-12 MED ORDER — OXYCODONE HCL 5 MG PO TABS: 5.0000 mg | ORAL_TABLET | ORAL | 0 refills | Status: DC | PRN

## 2022-08-12 MED ORDER — METHOCARBAMOL 500 MG PO TABS: 500.0000 mg | ORAL_TABLET | Freq: Three times a day (TID) | ORAL | 0 refills | Status: DC | PRN

## 2022-08-12 NOTE — Progress Notes (Addendum)
PT Cancellation Note  Patient Details Name: Brian Cain MRN: [id number] DOB: 1966-05-14   Cancelled Treatment:    Reason Eval/Treat Not Completed: PT screened, no needs identified, will sign off. OT notified PT that pt does not have any PT needs as he can mobilize/ambulate without issues. PT will sign off.   Moishe Spice, PT, DPT Acute Rehabilitation Services  Office: [place] 08/12/2022, 9:53 AM

## 2022-08-12 NOTE — Evaluation (Addendum)
Occupational Therapy Evaluation Patient Details Name: Brian Cain MRN: [id number] DOB: [birth year] Today's Date: 08/12/2022   History of Present Illness Pt is a 56 y/o male S/P right shoulder arthroplasty and bicep tenodesis completed on 08/11/22 by Dr. August Saucer.   Clinical Impression   Pt in bed upon therapy arrival and agreeable to participate in OT evaluation. Pt provided with education regarding shoulder protocol and HEP. OT provided VC and visual demonstration during education with pt verbalizing understanding. Pt lives with 2 teenager daughter who are able to provide assistance PRN once is discharged home. All education complete and questions answered. Pt will follow up with Dr. August Saucer for his post op appointment and begin outpatient therapy when recommended.       Recommendations for follow up therapy are one component of a multi-disciplinary discharge planning process, led by the attending physician.  Recommendations may be updated based on patient status, additional functional criteria and insurance authorization.   Follow Up Recommendations  Follow physician's recommendations for discharge plan and follow up therapies    Assistance Recommended at Discharge Other (comment) (Assist will be needed for bathing and dressing initially to assist with operative UE.)  Patient can return home with the following Assist for transportation;Assistance with cooking/housework;A little help with bathing/dressing/bathroom    Functional Status Assessment  Patient has had a recent decline in their functional status and demonstrates the ability to make significant improvements in function in a reasonable and predictable amount of time.  Equipment Recommendations  None recommended by OT       Precautions / Restrictions Precautions Precautions: Shoulder Type of Shoulder Precautions: R shoulder surgical precautions: Sling on at all time (except for bathing, dressing, and exercises), pendulums ok, A/ROM  wrist, elbow, hand ok, er 0-30 degrees only Shoulder Interventions: Shoulder sling/immobilizer;At all times;Off for dressing/bathing/exercises Precaution Booklet Issued: Yes (comment) Precaution Comments: Provided handout and reviewed information Restrictions Weight Bearing Restrictions: Yes RUE Weight Bearing: Non weight bearing      Mobility Bed Mobility Overal bed mobility: Independent       Patient Response: Cooperative  Transfers Overall transfer level: Independent Equipment used: None       Balance Overall balance assessment: No apparent balance deficits (not formally assessed)         ADL either performed or assessed with clinical judgement   ADL Overall ADL's : Needs assistance/impaired Eating/Feeding: Set up;Bed level   Grooming: Wash/dry hands;Wash/dry face;Oral care;Applying deodorant;Minimal assistance;Bed level Grooming Details (indicate cue type and reason): Assist needed to apply deodorant under right arm. Upper Body Bathing: Minimal assistance;Bed level;Adhering to UE precautions;Cueing for UE precautions   Lower Body Bathing: Set up;Sit to/from stand   Upper Body Dressing : Minimal assistance;Cueing for UE precautions;Adhering to UE precautions;Bed level   Lower Body Dressing: Set up;Sit to/from stand   Toilet Transfer: Supervision/safety;Regular Social worker and Hygiene: Supervision/safety;Cueing for compensatory techniques;Sit to/from stand         Vision Baseline Vision/History: 1 Wears glasses (reading glasses) Ability to See in Adequate Light: 0 Adequate Patient Visual Report: No change from baseline Vision Assessment?: No apparent visual deficits     Perception Perception Perception: Within Functional Limits   Praxis Praxis Praxis: Intact    Pertinent Vitals/Pain Pain Assessment Pain Assessment: 0-10 Pain Score: 8  Pain Location: right shoulder Pain Descriptors / Indicators: Aching, Constant Pain  Intervention(s): Limited activity within patient's tolerance, Monitored during session, Patient requesting pain meds-RN notified, Ice applied     Hand Dominance  Right   Extremity/Trunk Assessment Upper Extremity Assessment Upper Extremity Assessment: RUE deficits/detail RUE Deficits / Details: Recent shoulder surgery. A/ROM [facility] wrist, elbow, hand in all ranges. RUE: Unable to fully assess due to immobilization RUE Sensation:  (mild numbness in right wrist due to nerve block) RUE Coordination: decreased fine motor;decreased gross motor   Lower Extremity Assessment Lower Extremity Assessment: Overall WFL for tasks assessed       Communication Communication Communication: No difficulties   Cognition Arousal/Alertness: Awake/alert Behavior During Therapy: WFL for tasks assessed/performed Overall Cognitive Status: Within Functional Limits for tasks assessed                 Shoulder Instructions Shoulder Instructions Donning/doffing shirt without moving shoulder: Patient able to independently direct caregiver Method for sponge bathing under operated UE: Patient able to independently direct caregiver Donning/doffing sling/immobilizer: Patient able to independently direct caregiver Correct positioning of sling/immobilizer: Patient able to independently direct caregiver Pendulum exercises (written home exercise program): Independent ROM for elbow, wrist and digits of operated UE: Independent Sling wearing schedule (on at all times/off for ADL's): Independent Proper positioning of operated UE when showering: Independent Positioning of UE while sleeping: [place] expects to be discharged to:: Private residence Living Arrangements: Children Available Help at Discharge: Family;Available PRN/intermittently Type of Home: House Home Access: Level entry     Home Layout: One level     Bathroom Shower/Tub: Teacher, early years/pre:  Standard     Home Equipment: None          Prior Functioning/Environment Prior Level of Function : Working/employed     Mobility Comments: Independent 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        OT Problem List: Pain;Decreased strength;Decreased coordination;Decreased range of motion;Decreased activity tolerance;Decreased knowledge of precautions;Impaired UE functional use      OT Treatment/Interventions:   Eval only      OT Frequency:  1 time visit       AM-PAC OT "6 Clicks" Daily Activity     Outcome Measure Help from another person eating meals?: None Help from another person taking care of personal grooming?: A Little Help from another person toileting, which includes using toliet, bedpan, or urinal?: A Little Help from another person bathing (including washing, rinsing, drying)?: A Little Help from another person to put on and taking off regular upper body clothing?: A Little Help from another person to put on and taking off regular lower body clothing?: None 6 Click Score: 20   End of Session Nurse Communication: Patient requests pain meds  Activity Tolerance: Patient tolerated treatment well;Patient limited by pain Patient left: in bed;with call bell/phone within reach  OT Visit Diagnosis: Muscle weakness (generalized) (M62.81);Pain Pain - Right/Left: Right Pain - part of body: Shoulder                Time: 3716-9678 OT Time Calculation (min): 38 min Charges:  OT General Charges $OT Visit: 1 Visit OT Evaluation $OT Eval High Complexity: 1 High  [employer], OTR/L,CBIS  Supplemental OT - [facility]   Lecia Esperanza, Clarene Duke 08/12/2022, 10:27 AM

## 2022-08-12 NOTE — Progress Notes (Addendum)
  Subjective: Brian Cain is a 56 y.o. male s/p right RSA.  They are POD 1.  Pt's pain is controlled but moderate.  Patient denies any complaints of chest pain, shortness of breath, abdominal pain.  No fevers or chills.  Block is wearing off.  Really has no complaint aside from shoulder pain  Objective: Vital signs in last 24 hours: Temp:  [98.1 F (36.7 C)-99 F (37.2 C)] 98.3 F (36.8 C) (11/01 0807) Pulse Rate:  [60-79] 65 (11/01 0807) Resp:  [11-23] 16 (11/01 0807) BP: (109-164)/(78-97) 155/85 (11/01 0807) SpO2:  [95 %-100 %] 99 % (11/01 0807)  Intake/Output from previous day: 10/31 0701 - 11/01 0700 In: 1700 [I.V.:1300; IV Piggyback:400] Out: 1300 [Urine:800; Blood:500] Intake/Output this shift: No intake/output data recorded.  Exam:  No gross blood or drainage overlying the dressing 2+ radial pulse of the operative extremity Postoperative physical exam somewhat limited by interscalene block but intact EPL, FPL, finger abduction, pronation/supination, tricep extension, deltoid abduction. Axillary nerve is intact with deltoid firing.  Bicep flexion strength is limited   Labs: Recent Labs    08/11/22 0546  HGB 13.6   Recent Labs    08/11/22 0546  WBC 6.8  RBC 4.41  HCT 40.8  PLT 250   Recent Labs    08/11/22 0546  NA 142  K 4.2  CL 107  CO2 21*  BUN 13  CREATININE 0.95  GLUCOSE 126*  CALCIUM 9.1   No results for input(s): "LABPT", "INR" in the last 72 hours.  Assessment/Plan: Pt is POD 1 s/p right RSA    -Plan to discharge to home today; he is concerned about pain control so I will call him around noon and as long as he is doing reasonably well we will plan on discharge home today.  No lifting more than about 1 pound with the operative arm but he is okay for passive range of motion of the right shoulder.  He has CPM machine has been delivered to his house.  -Follow-up with Dr. Marlou Sa in clinic 2 weeks postoperatively     [facility] 08/12/2022,  8:42 AM

## 2022-08-12 NOTE — Progress Notes (Signed)
Discharge instructions given to pt at this time. He is alert and stable at baseline, currently eating meal. Pt states ride will be here at [facility]. SWOT RN to transport pt to DC lounge once he is done eating and getting dressed.

## 2022-08-15 DIAGNOSIS — M7521 Bicipital tendinitis, right shoulder: Secondary | ICD-10-CM

## 2022-08-15 DIAGNOSIS — M19011 Primary osteoarthritis, right shoulder: Secondary | ICD-10-CM

## 2022-08-17 ENCOUNTER — Telehealth: Payer: Self-pay | Admitting: Orthopedic Surgery

## 2022-08-17 ENCOUNTER — Other Ambulatory Visit: Payer: Self-pay | Admitting: Surgical

## 2022-08-17 MED ORDER — OXYCODONE HCL 5 MG PO TABS: 5.0000 mg | ORAL_TABLET | ORAL | 0 refills | Status: DC | PRN

## 2022-08-17 NOTE — Telephone Encounter (Signed)
Send in refill. Goal is off opioid medication by 6 weeks postop

## 2022-08-17 NOTE — Telephone Encounter (Signed)
Patient called. Would like some pain medication. Something stronger than what he had. [phone]

## 2022-08-18 ENCOUNTER — Telehealth: Payer: Self-pay | Admitting: Orthopedic Surgery

## 2022-08-18 ENCOUNTER — Other Ambulatory Visit: Payer: Self-pay | Admitting: Surgical

## 2022-08-18 NOTE — Telephone Encounter (Signed)
Patient aware this was sent in

## 2022-08-18 NOTE — Telephone Encounter (Signed)
Patient aware.

## 2022-08-18 NOTE — Telephone Encounter (Signed)
I sent the RX to the [facility] on [address] because that was the one listed in his chart. Sent this in yesterday after his last message. Does he need me to switch pharmacies or can he just go get from [place]?

## 2022-08-18 NOTE — Telephone Encounter (Signed)
Patient called needing Rx for pain medication. Patient said he thought he could go without pain medicine but, he said he is going through something right now because the pain is unbearable.  Patient asked if Dr. Marlou Sa would call him as soon as possible.  [facility] uses the [facility] on [address].    The number to contact patient is [phone]

## 2022-08-27 NOTE — Discharge Summary (Signed)
Physician Discharge Summary      Patient ID: Brian Cain MRN: [id number] DOB/AGE: [birth year] 56 y.o.  Admit date: 08/11/2022 Discharge date: 08/12/2022  Admission Diagnoses:  Principal Problem:   S/P reverse total shoulder arthroplasty, right Active Problems:   Arthritis of right shoulder region   Biceps tendonitis on right   Discharge Diagnoses:  Same  Surgeries: Procedure(s): RIGHT REVERSE SHOULDER ARTHROPLASTY on 08/11/2022   Consultants:   Discharged Condition: Stable  Hospital Course: Brian Cain is an 56 y.o. male who was admitted 08/11/2022 with a chief complaint of right shoulder pain, and found to have a diagnosis of right shoulder osteoarthritis.  They were brought to the operating room on 08/11/2022 and underwent the above named procedures.  Pt awoke from anesthesia without complication and was transferred to the floor. On POD1, patient's pain was moderate but controlled.  No complaint of chest pain or shortness of breath.  He is able to ambulate around his room.  He was discharged home on POD 1..  Pt will f/u with Dr. August Saucerean in clinic in ~2 weeks.   Antibiotics given:  Anti-infectives (From admission, onward)    Start     Dose/Rate Route Frequency Ordered Stop   08/11/22 2200  ceFAZolin (ANCEF) IVPB 2g/100 mL premix  Status:  Discontinued        2 g 200 mL/hr over 30 Minutes Intravenous Every 8 hours 08/11/22 1803 08/12/22 2100   08/11/22 1126  vancomycin (VANCOCIN) powder  Status:  Discontinued          As needed 08/11/22 1126 08/11/22 1222   08/11/22 0600  ceFAZolin (ANCEF) IVPB 2g/100 mL premix        2 g 200 mL/hr over 30 Minutes Intravenous On call to O.R. 08/11/22 [id number] 08/11/22 [id number]     .  Recent vital signs:  Vitals:   08/12/22 0600 08/12/22 0807  BP: 136/78 (!) 155/85  Pulse: 68 65  Resp: 16 16  Temp: 98.1 F (36.7 C) 98.3 F (36.8 C)  SpO2: 98% 99%    Recent laboratory studies:  Results for orders placed or performed during the  hospital encounter of 08/11/22  Surgical pcr screen   Specimen: Nasal Mucosa; Nasal Swab  Result Value Ref Range   MRSA, PCR NEGATIVE NEGATIVE   Staphylococcus aureus NEGATIVE NEGATIVE  CBC  Result Value Ref Range   WBC 6.8 4.0 - 10.5 K/uL   RBC 4.41 4.22 - 5.81 MIL/uL   Hemoglobin 13.6 13.0 - 17.0 g/dL   HCT 30.840.8 65.739.0 - 84.652.0 %   MCV 92.5 80.0 - 100.0 fL   MCH 30.8 26.0 - 34.0 pg   MCHC 33.3 30.0 - 36.0 g/dL   RDW 96.213.2 95.211.5 - 84.115.5 %   Platelets 250 150 - 400 K/uL   nRBC 0.0 0.0 - 0.2 %  Basic metabolic panel  Result Value Ref Range   Sodium 142 135 - 145 mmol/L   Potassium 4.2 3.5 - 5.1 mmol/L   Chloride 107 98 - 111 mmol/L   CO2 21 (L) 22 - 32 mmol/L   Glucose, Bld 126 (H) 70 - 99 mg/dL   BUN 13 6 - 20 mg/dL   Creatinine, Ser 3.240.95 0.61 - 1.24 mg/dL   Calcium 9.1 8.9 - 40.110.3 mg/dL   GFR, Estimated >02>60 >72>60 mL/min   Anion gap 14 5 - 15    Discharge Medications:   Allergies as of 08/12/2022       Reactions   Bee Venom  Swelling        Medication List     STOP taking these medications    ALPRAZolam 1 MG tablet Commonly known as: XANAX       TAKE these medications    acetaminophen 325 MG tablet Commonly known as: TYLENOL Take 2 tablets (650 mg total) by mouth every 6 (six) hours as needed for mild pain (pain score 1-3 or temp > 100.5).   aspirin EC 81 MG tablet Take 1 tablet (81 mg total) by mouth daily. Swallow whole.   ICY HOT EX Apply 1 Application topically daily as needed (pain).   methocarbamol 500 MG tablet Commonly known as: ROBAXIN Take 1 tablet (500 mg total) by mouth every 8 (eight) hours as needed for muscle spasms.   multivitamin tablet Take 1 tablet by mouth daily.        Diagnostic Studies: DG Shoulder Right Port  Result Date: 08/11/2022 CLINICAL DATA:  Status post reverse total right shoulder arthroplasty. EXAM: RIGHT SHOULDER - 1 VIEW COMPARISON:  Right shoulder radiographs 11/24/2021 FINDINGS: Postsurgical changes of reverse  total right shoulder arthroplasty. The glenoid dome component screws appear to insert at the far inferior aspect of the glenoid, and there is a fairly extensive shallow glenoid fossa extending more superiorly, likely the sequela of the prior high-grade osteoarthritis and humeral head and glenoid fossa cortical flattening/remodeling prior to the current arthroplasty surgery. No perihardware lucency is seen to indicate hardware failure or loosening. No acute fracture or dislocation. Postoperative intra-articular and subacromial/subdeltoid air, as expected. Mild acromioclavicular joint space narrowing and peripheral osteophytosis. IMPRESSION: Postsurgical changes of reverse total right shoulder arthroplasty. No hardware failure is seen. Electronically Signed   By: Neita Garnet M.D.   On: 08/11/2022 14:03   MR CERVICAL SPINE WO CONTRAST  Result Date: 08/02/2022  [facility] [address], [address] [place], [place] [address] [phone] NEUROIMAGING REPORT STUDY DATE: 08/02/2022 PATIENT NAME: Brian Cain DOB: 25-Mar-1966 MRN: [id number] EXAM: MRI of the cervical spine ORDERING CLINICIAN: Levert Feinstein MD, PhD CLINICAL HISTORY: 56 year old man with neck pain COMPARISON FILMS: None TECHNIQUE: MRI of the cervical spine was obtained utilizing 3 mm sagittal slices from the posterior fossa down to the T3-4 level with T1, T2 and inversion recovery views. In addition 4 mm axial slices from C2-3 down to T1-2 level were included with T2 and gradient echo views. CONTRAST: None IMAGING SITE: [facility], [address] [place], [place], [place] FINDINGS: :  On sagittal images, the spine is imaged from above the cervicomedullary junction to T2.   The visible brain and the cervicomedullary junction appears normal.  Paravertebral soft tissue appears normal.  The spinal cord is of normal caliber and signal.   The vertebral bodies are normally aligned.   There are mild endplate degenerative changes at C6-C7  associated with reduced disc height..  The discs and interspaces were further evaluated on axial views from C2 to T1 as follows: C2-C3: There is minimal left facet hypertrophy and minimal disc bulging.  No foraminal narrowing, spinal stenosis or nerve root compression. C3-C4: There is mild left facet hypertrophy, minimal uncovertebral spurring and minimal disc bulging.  There is mild left foraminal narrowing but no spinal stenosis or nerve root compression. C4-C5:  There is severe left facet hypertrophy, disc bulging and uncovertebral spurring.  This causes mild spinal stenosis and moderate left and mild to moderate right foraminal narrowing.  There does not appear to be nerve root compression. C5-C6: There is mild left greater  than right facet hypertrophy, disc protrusion, minimal uncovertebral spurring.  Central canal is narrowed but not enough to be considered spinal stenosis.  There is mild right greater than left foraminal narrowing but no nerve root compression. C6-C7: There is mild facet hypertrophy, disc bulging and uncovertebral spurring causing mild spinal stenosis and mild to moderate bilateral foraminal narrowing but no nerve root compression. C7-T1: The disc interspace appear normal.   This MRI of the cervical spine without contrast shows the following: At C2-C3 and C3-C4 there are mild degenerative changes but no nerve root compression or spinal stenosis. At C4-C5, there is severe left facet hypertrophy and other degenerative changes causing mild spinal stenosis and moderate left foraminal narrowing.  There did not appear to be nerve root compression. At C5-C6, there are degenerative changes.  The central canal is narrowed but not on left may consider spinal stenosis.  There is no nerve root compression. At C6-C7: There are degenerative changes causing mild spinal stenosis and mild to moderate foraminal narrowing but no nerve root compression. INTERPRETING PHYSICIAN: Richard A. Epimenio Foot, MD, PhD, FAAN  Certified in  Neuroimaging by [employer]   MR BRAIN WO CONTRAST  Result Date: 08/02/2022  [facility] [address], [address] [place], [place] [address] [phone] NEUROIMAGING REPORT STUDY DATE: 08/02/2022 PATIENT NAME: HARLESS MOLINARI DOB: 05/20/1966 MRN: [id number] EXAM: MRI Brain without contrast ORDERING CLINICIAN: Levert Feinstein MD, PhD CLINICAL HISTORY: 56 year old man with movements and neck pain COMPARISON FILMS: None TECHNIQUE: MRI of the brain without contrast was obtained utilizing 5 mm axial slices with T1, T2, T2 flair, SWI and diffusion weighted views.  T1 sagittal and T2 coronal views were obtained. CONTRAST: none IMAGING SITE: [facility], [address] [place], [place] FINDINGS: On sagittal images, the spinal cord is imaged caudally to C4 and is normal in caliber.   The contents of the posterior fossa are of normal size and position.   The pituitary gland and optic chiasm appear normal.    Brain volume appears normal.   The ventricles are normal in size and without distortion.  There are no abnormal extra-axial collections of fluid.  In the hemispheres, there are some scattered T2/FLAIR hyperintense foci in the subcortical and deep white matter.  None of these appear to be acute.  There is also a small focus within the pons.  The cerebellum appears normal.   The deep gray matter appears normal.   Diffusion weighted images are normal.  Susceptibility weighted images are normal.  The orbits appear normal.   The VIIth/VIIIth nerve complex appears normal.  The mastoid air cells appear normal.   Mucoperiosteal thickening is noted within the maxillary sinuses and some of the ethmoid air cells.  There are mucous retention cysts in the left and the sphenoid and right maxillary sinus flow voids are identified within the major intracerebral arteries.     This MRI of the brain without contrast shows the following: No acute findings. Few T2/FLAIR hyperintense  foci in the subcortical and deep white matter and one focus in the pons consistent with mild chronic microvascular ischemic change.  None of these appear to be acute. Ethmoid and maxillary chronic sinusitis as detailed above. INTERPRETING PHYSICIAN: Richard A. Epimenio Foot, MD, PhD, FAAN Certified in  Neuroimaging by [employer]    Disposition: Discharge disposition: 01-Home or Self Care       Discharge Instructions     Call MD / Call 911   Complete by: As  directed    If you experience chest pain or shortness of breath, CALL 911 and be transported to the hospital emergency room.  If you develope a fever above 101 F, pus (white drainage) or increased drainage or redness at the wound, or calf pain, call your surgeon's office.   Constipation Prevention   Complete by: As directed    Drink plenty of fluids.  Prune juice may be helpful.  You may use a stool softener, such as Colace (over the counter) 100 mg twice a day.  Use MiraLax (over the counter) for constipation as needed.   Diet - low sodium heart healthy   Complete by: As directed    Discharge instructions   Complete by: As directed    You may shower, dressing is waterproof.  Do not bathe or soak the operative shoulder in a tub, pool.  Use the CPM machine 3 times a day for one hour each time, increasing the degrees of range of motion with each session.  No lifting with the operative shoulder. Continue use of the sling.  Follow-up with Dr. August Saucer in ~2 weeks on your given appointment date.  We will remove your adhesive bandage at that time.  Call the office with any questions or concerns at [phone]. Continue with exercises that were given by therapy in the hospital.  Dental Antibiotics:  In most cases prophylactic antibiotics for Dental procdeures after total joint surgery are not necessary.  Exceptions are as follows:  1. History of prior total joint infection  2. Severely immunocompromised (Organ Transplant, cancer  chemotherapy, Rheumatoid biologic meds such as Humera)  3. Poorly controlled diabetes (A1C &gt; 8.0, blood glucose over 200)  If you have one of these conditions, contact your surgeon for an antibiotic prescription, prior to your dental procedure.   Increase activity slowly as tolerated   Complete by: As directed    Post-operative opioid taper instructions:   Complete by: As directed    POST-OPERATIVE OPIOID TAPER INSTRUCTIONS: It is important to wean off of your opioid medication as soon as possible. If you do not need pain medication after your surgery it is ok to stop day one. Opioids include: Codeine, Hydrocodone(Norco, Vicodin), Oxycodone(Percocet, oxycontin) and hydromorphone amongst others.  Long term and even short term use of opiods can cause: Increased pain response Dependence Constipation Depression Respiratory depression And more.  Withdrawal symptoms can include Flu like symptoms Nausea, vomiting And more Techniques to manage these symptoms Hydrate well Eat regular healthy meals Stay active Use relaxation techniques(deep breathing, meditating, yoga) Do Not substitute Alcohol to help with tapering If you have been on opioids for less than two weeks and do not have pain than it is ok to stop all together.  Plan to wean off of opioids This plan should start within one week post op of your joint replacement. Maintain the same interval or time between taking each dose and first decrease the dose.  Cut the total daily intake of opioids by one tablet each day Next start to increase the time between doses. The last dose that should be eliminated is the evening dose.             SignedKarenann Cai 08/27/2022, 11:22 AM

## 2022-08-31 ENCOUNTER — Encounter: Payer: Self-pay | Admitting: Surgical

## 2022-08-31 ENCOUNTER — Ambulatory Visit (INDEPENDENT_AMBULATORY_CARE_PROVIDER_SITE_OTHER): Payer: No Typology Code available for payment source

## 2022-08-31 ENCOUNTER — Ambulatory Visit (INDEPENDENT_AMBULATORY_CARE_PROVIDER_SITE_OTHER): Payer: No Typology Code available for payment source | Admitting: Surgical

## 2022-08-31 DIAGNOSIS — G8929 Other chronic pain: Secondary | ICD-10-CM | POA: Diagnosis not present

## 2022-08-31 DIAGNOSIS — M25511 Pain in right shoulder: Secondary | ICD-10-CM | POA: Diagnosis not present

## 2022-08-31 DIAGNOSIS — Z96611 Presence of right artificial shoulder joint: Secondary | ICD-10-CM

## 2022-08-31 NOTE — Progress Notes (Signed)
Post-Op Visit Note   Patient: Brian Cain           Date of Birth: 08/28/1966           MRN: [id number] Visit Date: 08/31/2022 PCP: Nolene Ebbs, MD   Assessment & Plan:  Chief Complaint:  Chief Complaint  Patient presents with   Right Shoulder - Routine Post Op   Visit Diagnoses:  1. S/P reverse total shoulder arthroplasty, right     Plan: JONHATAN Cain is a 56 y.o. male who presents s/p right reverse shoulder arthroplasty on 08/11/22.  Patient is doing well and pain is overall controlled.  Has been using CPM machine which he feels has been helpful.  Denies any chest pain, SOB, fevers, chills.  No drainage from incision. No complaint of any instability symptoms but does state that he feels compulsions to keep the shoulder moving. States he is just taking Tylenol for pain control as the percocet was not very helpful  On exam, patient has range of motion 15 degrees ER, 80 degrees abduction, 90 degrees forward flexion.  Intact EPL, FPL, finger abduction, finger adduction, pronation/supination, bicep, tricep, deltoid of operative extremity.  Axillary nerve intact with deltoid firing.  Incision is healing well without evidence of infection or dehiscence.   2+ radial pulse of the operative extremity  Plan is discontinue sling.  Okay to very lightly lift with the operative extremity but no lifting anything heavier than a coffee cup or cell phone.  Start physical therapy to focus on passive range of motion and active range of motion with deltoid isometrics.  No external rotation past 30 degrees. Follow-up in 4 weeks for clinical recheck.  Patient is welcome to reach out to the office via MyChart or by calling office phone number in the meantime between now and their next appointment. Radiographs taken today demonstrate no significant change compared with postop xrays and no significant complicating features of the implant.   Follow-Up Instructions: No follow-ups on file.   Orders:   Orders Placed This Encounter  Procedures   XR Shoulder Right   No orders of the defined types were placed in this encounter.   Imaging: No results found.  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[facility]) 10/26/2011   Past Medical History:  Diagnosis Date   Arthritis    shoulder    Family History  Problem Relation Age of Onset   Cancer Mother        breast    Past Surgical History:  Procedure Laterality Date   CLOSED REDUCTION MANDIBLE WITH MANDIBULOMA N/A 02/16/2014   Procedure: CLOSED REDUCTION MANDIBLE WITH MANDIBULOMAXILLARY FUSION;  Surgeon: Izora Gala, MD;  Location: [facility];  Service: ENT;  Laterality: N/A;   EXTERNAL FIXATOR AND ARCH BAR REMOVAL Bilateral 02/16/2014   Procedure:  ARCH BARS PLACEMENT;  Surgeon: Izora Gala, MD;  Location: [facility];  Service: ENT;  Laterality: Bilateral;   I & D EXTREMITY  08/22/2011   Procedure: IRRIGATION AND DEBRIDEMENT EXTREMITY;  Surgeon: Tennis Must;  Location: [facility];  Service: Orthopedics;  Laterality: Right;   MANDIBULAR  HARDWARE REMOVAL N/A 04/09/2014   Procedure: MANDIBULAR HARDWARE REMOVAL;  Surgeon: Serena Colonel, MD;  Location: [facility];  Service: ENT;  Laterality: N/A;   PATELLAR TENDON REPAIR Left 02/06/2021   Procedure: LEFT PATELLA TENDON PRIMARY REPAIR;  Surgeon: Kathryne Hitch, MD;  Location: [facility];  Service: Orthopedics;  Laterality: Left;  block   REVERSE SHOULDER ARTHROPLASTY Right 08/11/2022   Procedure: RIGHT REVERSE SHOULDER ARTHROPLASTY;  Surgeon: Cammy Copa, MD;   Location: [facility];  Service: Orthopedics;  Laterality: Right;  Regional   WRIST SURGERY     Social History   Occupational History   Not on file  Tobacco Use   Smoking status: Former    Packs/day: 0.30    Years: 3.00    Total pack years: 0.90    Types: Cigarettes    Quit date: 02/24/2012    Years since quitting: 10.5   Smokeless tobacco: Never  Vaping Use   Vaping Use: Never used  Substance and Sexual Activity   Alcohol use: Not Currently    Comment: occ   Drug use: Yes    Types: Marijuana    Comment: Last use was on 08/08/22   Sexual activity: Yes    Birth control/protection: Condom

## 2022-09-02 ENCOUNTER — Other Ambulatory Visit: Payer: Self-pay | Admitting: Surgical

## 2022-09-02 MED ORDER — OXYCODONE HCL 5 MG PO TABS: 5.0000 mg | ORAL_TABLET | ORAL | 0 refills | Status: DC | PRN

## 2022-09-02 NOTE — Telephone Encounter (Signed)
Refilled at 5MG , he's >2 weeks out from surgery, can't start increasing pain doses at this point

## 2022-09-02 NOTE — Telephone Encounter (Signed)
Patient called in stating he would like a refill on Pain medication but would also like Oxycodone to me bumped up to 10 MG instead of 5 MG being the 5 is not working well and pain levels are still high

## 2022-09-07 NOTE — Telephone Encounter (Signed)
I called patient, he is feeling some better. He states that he has to take 2-3 of the pain pills due to the pain that he is having and states that he wished we would increase the dose. I explained, per Luke's message, that since he is greater than 2 weeks out from surgery, we cannot give higher dose. He expressed understanding.  Sending back to you as FYI on how much medication he is taking.

## 2022-09-08 NOTE — Addendum Note (Signed)
Addended by: Rogers Seeds on: 09/08/2022 08:25 AM   Modules accepted: Orders

## 2022-09-10 ENCOUNTER — Other Ambulatory Visit: Payer: Self-pay | Admitting: Surgical

## 2022-09-10 MED ORDER — GABAPENTIN 300 MG PO CAPS: 300.0000 mg | ORAL_CAPSULE | Freq: Three times a day (TID) | ORAL | 0 refills | Status: AC

## 2022-09-10 MED ORDER — OXYCODONE HCL 5 MG PO TABS: 5.0000 mg | ORAL_TABLET | Freq: Three times a day (TID) | ORAL | 0 refills | Status: DC | PRN

## 2022-09-10 MED ORDER — CELECOXIB 100 MG PO CAPS: 100.0000 mg | ORAL_CAPSULE | Freq: Two times a day (BID) | ORAL | 0 refills | Status: AC

## 2022-09-10 NOTE — Telephone Encounter (Signed)
I called him to advise him not to take so many pills.  He agreed with this plan.  He will take 1 pill every 8 hours.  I also prescribed Celebrex and gabapentin to help him with pain control in addition to the oxycodone.  We will work on weaning off of this medication overall by 4 to 6 weeks postop and he agreed with this plan.  He is wondering why he has not heard from physical therapy to schedule yet.  In the referrals tab it looks like they have called him but had to leave voicemail as I could get a hold of him.  I advised him to call the office in order to schedule appointment with physical therapy.

## 2022-09-10 NOTE — Telephone Encounter (Signed)
noted 

## 2022-09-17 ENCOUNTER — Encounter: Payer: Self-pay | Admitting: Physical Therapy

## 2022-09-17 ENCOUNTER — Telehealth: Payer: Self-pay | Admitting: Orthopedic Surgery

## 2022-09-17 ENCOUNTER — Other Ambulatory Visit: Payer: Self-pay

## 2022-09-17 ENCOUNTER — Ambulatory Visit (INDEPENDENT_AMBULATORY_CARE_PROVIDER_SITE_OTHER): Payer: Commercial Managed Care - HMO | Admitting: Physical Therapy

## 2022-09-17 DIAGNOSIS — M25611 Stiffness of right shoulder, not elsewhere classified: Secondary | ICD-10-CM

## 2022-09-17 DIAGNOSIS — M25511 Pain in right shoulder: Secondary | ICD-10-CM

## 2022-09-17 DIAGNOSIS — R293 Abnormal posture: Secondary | ICD-10-CM | POA: Diagnosis not present

## 2022-09-17 DIAGNOSIS — M6281 Muscle weakness (generalized): Secondary | ICD-10-CM

## 2022-09-17 NOTE — Therapy (Addendum)
OUTPATIENT PHYSICAL THERAPY SHOULDER EVALUATION+ DISCHARGE SUMMARY   Patient Name: Brian Cain MRN: [id number] DOB:May 08, 1966, 56 y.o., male Today's Date: 09/17/2022  END OF SESSION:  PT End of Session - 09/17/22 1548     Visit Number 1    Number of Visits 12    Date for PT Re-Evaluation 10/29/22    Authorization Type [employer]    Authorization - Number of Visits 30    PT Start Time 1320    PT Stop Time 1345    PT Time Calculation (min) 25 min    Activity Tolerance Patient tolerated treatment well    Behavior During Therapy WFL for tasks assessed/performed;Restless             Past Medical History:  Diagnosis Date   Arthritis    shoulder   Past Surgical History:  Procedure Laterality Date   CLOSED REDUCTION MANDIBLE WITH MANDIBULOMA N/A 02/16/2014   Procedure: CLOSED REDUCTION MANDIBLE WITH MANDIBULOMAXILLARY FUSION;  Surgeon: Serena ColonelJefry Rosen, MD;  [facility];  Service: ENT;  Laterality: N/A;   EXTERNAL FIXATOR AND ARCH BAR REMOVAL Bilateral 02/16/2014   Procedure:  ARCH BARS PLACEMENT;  Surgeon: Serena ColonelJefry Rosen, MD;  Location: [facility] OR;  Service: ENT;  Laterality: Bilateral;   I & D EXTREMITY  08/22/2011   Procedure: IRRIGATION AND DEBRIDEMENT EXTREMITY;  Surgeon: Tami RibasKevin R Kuzma;  Location: [facility] OR;  Service: Orthopedics;  Laterality: Right;   MANDIBULAR HARDWARE REMOVAL N/A 04/09/2014   Procedure: MANDIBULAR HARDWARE REMOVAL;  Surgeon: Serena ColonelJefry Rosen, MD;  Location: [facility];  Service: ENT;  Laterality: N/A;   PATELLAR TENDON REPAIR Left 02/06/2021   Procedure: LEFT PATELLA TENDON PRIMARY REPAIR;  Surgeon: Kathryne HitchBlackman, Christopher Y, MD;  Location: [facility];  Service: Orthopedics;  Laterality: Left;  block   REVERSE SHOULDER ARTHROPLASTY Right 08/11/2022   Procedure: RIGHT REVERSE SHOULDER ARTHROPLASTY;  Surgeon: Cammy Copaean, Gregory Scott, MD;  [facility];  Service: Orthopedics;  Laterality: Right;  Regional   WRIST SURGERY     Patient Active Problem  List   Diagnosis Date Noted   Arthritis of right shoulder region 08/15/2022   Biceps tendonitis on right 08/15/2022   S/P reverse total shoulder arthroplasty, right 08/11/2022   Abnormal movement 07/14/2022   Patellar tendon rupture, left, subsequent encounter 02/05/2021   Hidradenitis suppurativa 12/12/2020   Open body of mandible fracture (HCC) 02/16/2014   Bee sting 06/08/2012   Osteomyelitis of hand (HCC) 10/26/2011    PCP: Fleet ContrasAvbuere, Edwin, MD  REFERRING PROVIDER: Julieanne CottonMagnant, Charles L, PA-C  REFERRING DIAG: [id number] (ICD-10-CM) - S/P reverse total shoulder arthroplasty, right  THERAPY DIAG:  Acute pain of right shoulder - Plan: PT plan of care cert/re-cert  Stiffness of right shoulder, not elsewhere classified - Plan: PT plan of care cert/re-cert  Abnormal posture - Plan: PT plan of care cert/re-cert  Muscle weakness (generalized) - Plan: PT plan of care cert/re-cert  Rationale for Evaluation and Treatment: Rehabilitation  ONSET DATE: 08/11/22  SUBJECTIVE:  SUBJECTIVE STATEMENT: Pt is a 56 y/o male who presents to OPPT s/p Rt Reverse TSA on 08/11/22.  He reports his shoulder feels great, but his body has been in increased spasms which is now causing more pain and discomfort.    PERTINENT HISTORY: Lt patellar tendon rupture, increased tics  PAIN:  Are you having pain? Yes: NPRS scale: 7-8 currently, up to 14-15, at best 5/10 Pain location: body spasms Pain description: spasms Aggravating factors: movement Relieving factors: unknown  PRECAUTIONS: Shoulder:Start physical therapy to focus on passive range of motion and active range of motion with deltoid isometrics.  No external rotation past 30 degrees. Okay to very lightly lift with the operative extremity but no lifting anything heavier than a  coffee cup or cell phone.  WEIGHT BEARING RESTRICTIONS: No  FALLS:  Has patient fallen in last 6 months? No  LIVING ENVIRONMENT: Lives with: lives with their family (52 and 84 y/o daughters) Lives in: House/apartment  OCCUPATION: Has been out of work for 6-7 months, was a [employer] architect  PLOF: Independent  PATIENT GOALS:improve mobility of shoulder  NEXT MD VISIT: 09/21/22  OBJECTIVE:   COGNITION: Overall cognitive status: No family/caregiver present to determine baseline cognitive functioning     SENSATION: WFL  POSTURE: Rounded shoulders, forward head; pt with frequent involuntary tics throughout eval  UPPER EXTREMITY ROM:   Passive ROM Right eval Left eval  Shoulder flexion    Shoulder extension    Shoulder abduction    Shoulder adduction    Shoulder internal rotation    Shoulder external rotation 30   Elbow flexion    Elbow extension    Wrist flexion    Wrist extension    Wrist ulnar deviation    Wrist radial deviation    Wrist pronation    Wrist supination    (Blank rows = not tested)  Active ROM Right eval Left eval  Shoulder flexion 130   Shoulder extension    Shoulder abduction 100   Shoulder adduction    Shoulder internal rotation    Shoulder external rotation    Elbow flexion    Elbow extension    Wrist flexion    Wrist extension    Wrist ulnar deviation    Wrist radial deviation    Wrist pronation    Wrist supination    (Blank rows = not tested)  UPPER EXTREMITY MMT:   09/17/22: Deferred  OBSERVATIONS 09/17/22: pt initially arriving with LLE foot drag c/o his his "hips twisting" and by end of session amb without foot drag; significant involuntary tics throughout session mainly from neck and shoulders but occasionally core and lower extremities BP: 147/90   TODAY'S TREATMENT:                                                                                                                                         DATE: 09/17/22  See  HEP - demonstrated exercises with pt   PATIENT EDUCATION: Education details: HEP Person educated: Patient Education method: Medical illustrator Education comprehension: verbalized understanding and needs further education  HOME EXERCISE PROGRAM: Access Code: [email or web address] URL: [email or web address] Date: 09/17/2022 Prepared by: [name]  Exercises - Supine Shoulder Flexion Extension AAROM with Dowel  - 2-3 x daily - 7 x weekly - 1-2 sets - 10 reps - Seated Shoulder Abduction AAROM with Dowel  - 2-3 x daily - 7 x weekly - 1-2 sets - 10 reps - Isometric Shoulder Flexion at Wall  - 2-3 x daily - 7 x weekly - 1-2 sets - 10 reps - 3-5 sec hold - Isometric Shoulder Abduction at Wall  - 2-3 x daily - 7 x weekly - 1-2 sets - 10 reps - 3-5 sec hold - Isometric Shoulder Extension at Wall  - 2-3 x daily - 7 x weekly - 1-2 sets - 10 reps - 3-5 sec hold - Seated Scapular Retraction  - 2-3 x daily - 7 x weekly - 1-2 sets - 10 reps - 5 sec hold  ASSESSMENT:  CLINICAL IMPRESSION: Patient is a 56 y.o. male who was seen today for physical therapy evaluation and treatment s/p Rt reverse TSA on 08/11/22.  He demonstrates decreased strength and ROM as well as pain affecting functional mobility.  He has some involuntary tics that neurology appears to be following him for, but need to monitor.  He will benefit from PT to address deficits listed.   OBJECTIVE IMPAIRMENTS: decreased ROM, decreased strength, increased muscle spasms, impaired tone, postural dysfunction, and pain.   ACTIVITY LIMITATIONS: carrying, lifting, bathing, toileting, dressing, reach over head, hygiene/grooming, and locomotion level  PARTICIPATION LIMITATIONS: meal prep, cleaning, laundry, community activity, and occupation  PERSONAL FACTORS: 1-2 comorbidities: Lt patellar tendon rupture, increased tics  are also affecting patient's functional outcome.   REHAB POTENTIAL: Good  CLINICAL DECISION MAKING:  Evolving/moderate complexity  EVALUATION COMPLEXITY: Moderate   GOALS: Goals reviewed with patient? Yes  SHORT TERM GOALS: Target date: 10/08/2022  Independent with initial HEP Goal status: INITIAL  2.  Rt shoulder AROM abduction improved to 120 deg for improved function Goal status: INITIAL   LONG TERM GOALS: Target date: 10/29/2022  Independent with final HEP Goal status: INITIAL  2.  Rt shoulder flexion and abduction improved to 135 deg for improved function Goal status: INIITAL  3.  Report pain < 3/10 with overhead reaching  for improved function Goal status: INITIAL  4.  Demonstrate 4/5 Rt shoulder flexion and abduction strength for improved function Goal status: INITIAL   PLAN:  PT FREQUENCY: 1-2x/week  PT DURATION: 6 weeks  PLANNED INTERVENTIONS: Therapeutic exercises, Therapeutic activity, Neuromuscular re-education, Balance training, Gait training, Patient/Family education, Self Care, Joint mobilization, Aquatic Therapy, Dry Needling, Electrical stimulation, Cryotherapy, Moist heat, Taping, Vasopneumatic device, Ultrasound, Ionotophoresis 4mg /ml Dexamethasone, Manual therapy, and Re-evaluation  PLAN FOR NEXT SESSION: review HEP, progress as able, light ROM activities   , PT, DPT 09/17/22 3:55 PM   PHYSICAL THERAPY DISCHARGE SUMMARY  Visits from Start of Care: 1  Current functional level related to goals / functional outcomes: Unable to be assessed due to lack of follow up   Remaining deficits: See note - unable to be assessed since initial evaluation   Education / Equipment: Unable to be assessed    Patient unable to agree to discharge, unable to reach. Patient goals were  unable to be assessed . Patient is being  discharged due to not returning since the last visit. - per MD notes, therapy advised to be put on hold until pt follows up with neurology.   Ashley Murrain PT, DPT 10/08/2022 11:31 AM

## 2022-09-17 NOTE — Telephone Encounter (Signed)
Patient came in for PT but came down stating he wanted to speak with his Dr or a nurse being his pain medication was not working and he is in a lot of pain still I called triage and we got his appt rescheduled from 12/11 to 12/08 8:30. please advise on pain medication

## 2022-09-18 ENCOUNTER — Ambulatory Visit (INDEPENDENT_AMBULATORY_CARE_PROVIDER_SITE_OTHER): Payer: No Typology Code available for payment source | Admitting: Orthopedic Surgery

## 2022-09-18 DIAGNOSIS — Z96611 Presence of right artificial shoulder joint: Secondary | ICD-10-CM

## 2022-09-18 MED ORDER — OXYCODONE HCL 5 MG PO TABS: ORAL_TABLET | ORAL | 0 refills | Status: DC

## 2022-09-18 MED ORDER — METHOCARBAMOL 500 MG PO TABS: 500.0000 mg | ORAL_TABLET | Freq: Three times a day (TID) | ORAL | 0 refills | Status: AC | PRN

## 2022-09-18 NOTE — Telephone Encounter (Signed)
Came in today and spoke with Dr August Saucer

## 2022-09-19 ENCOUNTER — Encounter: Payer: Self-pay | Admitting: Orthopedic Surgery

## 2022-09-19 NOTE — Progress Notes (Addendum)
Post-Op Visit Note   Patient: Brian Cain           Date of Birth: June 27, 1966           MRN: [id number] Visit Date: 09/18/2022 PCP: Brian Contras, MD   Assessment & Plan:  Chief Complaint:  Chief Complaint  Patient presents with   Right Shoulder - Follow-up   Visit Diagnoses:  1. S/P reverse total shoulder arthroplasty, right     Plan: Brian Cain is a 56 year old patient who underwent right reverse shoulder replacement 08/11/2022.  He is having a lot of spasms and takes today.  He ran out of pain medicine around the same time he started having these spasms and tics.  He was taking oxycodone but does not report much in terms of severe pain.  He is doing well with his shoulder in terms of range of motion and strength.  On examination he has essentially forward flexion to 170 and abduction to about 1 10-1 20.  Strength is also exceptional in terms of external rotation and subscap strength.  At this time until we get the tics controlled I would like to discontinue therapy and discontinue the CPM machine.  Refer to neuro to evaluate these increased spasms and tics.  Start tapering oxycodone and refill muscle relaxer.  4-week return for likely final clinical check.  Follow-Up Instructions: No follow-ups on file.   Orders:  Orders Placed This Encounter  Procedures   Ambulatory referral to Neurology   Meds ordered this encounter  Medications   oxyCODONE (OXY IR/ROXICODONE) 5 MG immediate release tablet    Sig: 1 po q 8 hrs prn pain    Dispense:  30 tablet    Refill:  0   methocarbamol (ROBAXIN) 500 MG tablet    Sig: Take 1 tablet (500 mg total) by mouth every 8 (eight) hours as needed for muscle spasms.    Dispense:  30 tablet    Refill:  0    Imaging: No results found.  PMFS History: Patient Active Problem List   Diagnosis Date Noted   Arthritis of right shoulder region 08/15/2022   Biceps tendonitis on right 08/15/2022   S/P reverse total shoulder arthroplasty, right  08/11/2022   Abnormal movement 07/14/2022   Patellar tendon rupture, left, subsequent encounter 02/05/2021   Hidradenitis suppurativa 12/12/2020   Open body of mandible fracture (HCC) 02/16/2014   Bee sting 06/08/2012   Osteomyelitis of hand (HCC) 10/26/2011   Past Medical History:  Diagnosis Date   Arthritis    shoulder    Family History  Problem Relation Age of Onset   Cancer Mother        breast    Past Surgical History:  Procedure Laterality Date   CLOSED REDUCTION MANDIBLE WITH MANDIBULOMA N/A 02/16/2014   Procedure: CLOSED REDUCTION MANDIBLE WITH MANDIBULOMAXILLARY FUSION;  Surgeon: Serena Colonel, MD;  [facility]: [facility] OR;  Service: ENT;  Laterality: N/A;   EXTERNAL FIXATOR AND ARCH BAR REMOVAL Bilateral 02/16/2014   Procedure:  ARCH BARS PLACEMENT;  Surgeon: Serena Colonel, MD;  Location: [facility] OR;  Service: ENT;  Laterality: Bilateral;   I & D EXTREMITY  08/22/2011   Procedure: IRRIGATION AND DEBRIDEMENT EXTREMITY;  Surgeon: Tami Ribas;  Location: [facility] OR;  Service: Orthopedics;  Laterality: Right;   MANDIBULAR HARDWARE REMOVAL N/A 04/09/2014   Procedure: MANDIBULAR HARDWARE REMOVAL;  Surgeon: Serena Colonel, MD;  Location: [facility];  Service: ENT;  Laterality: N/A;   PATELLAR TENDON REPAIR Left  02/06/2021   Procedure: LEFT PATELLA TENDON PRIMARY REPAIR;  Surgeon: Kathryne Hitch, MD;  Location: [facility];  Service: Orthopedics;  Laterality: Left;  block   REVERSE SHOULDER ARTHROPLASTY Right 08/11/2022   Procedure: RIGHT REVERSE SHOULDER ARTHROPLASTY;  Surgeon: Cammy Copa, MD;  Location: [facility] OR;  Service: Orthopedics;  Laterality: Right;  Regional   WRIST SURGERY     Social History   Occupational History   Not on file  Tobacco Use   Smoking status: Former    Packs/day: 0.30    Years: 3.00    Total pack years: 0.90    Types: Cigarettes    Quit date: 02/24/2012    Years since quitting: 10.5   Smokeless tobacco: Never  Vaping Use    Vaping Use: Never used  Substance and Sexual Activity   Alcohol use: Not Currently    Comment: occ   Drug use: Yes    Types: Marijuana    Comment: Last use was on 08/08/22   Sexual activity: Yes    Birth control/protection: Condom

## 2022-09-21 ENCOUNTER — Encounter: Payer: No Typology Code available for payment source | Admitting: Orthopedic Surgery

## 2022-09-21 NOTE — Therapy (Deleted)
OUTPATIENT PHYSICAL THERAPY SHOULDER EVALUATION   Patient Name: Brian Cain MRN: [id number] DOB:20-Apr-1966, 56 y.o., male Today's Date: 09/21/2022  END OF SESSION:    Past Medical History:  Diagnosis Date   Arthritis    shoulder   Past Surgical History:  Procedure Laterality Date   CLOSED REDUCTION MANDIBLE WITH MANDIBULOMA N/A 02/16/2014   Procedure: CLOSED REDUCTION MANDIBLE WITH MANDIBULOMAXILLARY FUSION;  Surgeon: Serena Colonel, MD;  Location: [facility] OR;  Service: ENT;  Laterality: N/A;   EXTERNAL FIXATOR AND ARCH BAR REMOVAL Bilateral 02/16/2014   Procedure:  ARCH BARS PLACEMENT;  Surgeon: Serena Colonel, MD;  Location: [facility];  Service: ENT;  Laterality: Bilateral;   I & D EXTREMITY  08/22/2011   Procedure: IRRIGATION AND DEBRIDEMENT EXTREMITY;  Surgeon: Tami Ribas;  Location: [facility];  Service: Orthopedics;  Laterality: Right;   MANDIBULAR HARDWARE REMOVAL N/A 04/09/2014   Procedure: MANDIBULAR HARDWARE REMOVAL;  Surgeon: Serena Colonel, MD;  Location: [facility];  Service: ENT;  Laterality: N/A;   PATELLAR TENDON REPAIR Left 02/06/2021   Procedure: LEFT PATELLA TENDON PRIMARY REPAIR;  Surgeon: Kathryne Hitch, MD;  Location: [facility];  Service: Orthopedics;  Laterality: Left;  block   REVERSE SHOULDER ARTHROPLASTY Right 08/11/2022   Procedure: RIGHT REVERSE SHOULDER ARTHROPLASTY;  Surgeon: Cammy Copa, MD;  [facility];  Service: Orthopedics;  Laterality: Right;  Regional   WRIST SURGERY     Patient Active Problem List   Diagnosis Date Noted   Arthritis of right shoulder region 08/15/2022   Biceps tendonitis on right 08/15/2022   S/P reverse total shoulder arthroplasty, right 08/11/2022   Abnormal movement 07/14/2022   Patellar tendon rupture, left, subsequent encounter 02/05/2021   Hidradenitis suppurativa 12/12/2020   Open body of mandible fracture (HCC) 02/16/2014   Bee sting 06/08/2012   Osteomyelitis of hand (HCC)  10/26/2011    PCP: Fleet Contras, MD  REFERRING PROVIDER: Julieanne Cotton, PA-C  REFERRING DIAG: [phone] (ICD-10-CM) - S/P reverse total shoulder arthroplasty, right  THERAPY DIAG:  No diagnosis found.  Rationale for Evaluation and Treatment: Rehabilitation  ONSET DATE: 08/11/22  SUBJECTIVE:                                                                                                                                                                                      SUBJECTIVE STATEMENT: Pt is a 56 y/o male who presents to OPPT s/p Rt Reverse TSA on 08/11/22.  He reports his shoulder feels great, but his body has been in increased spasms which is now causing more pain and discomfort.    PERTINENT HISTORY: Lt patellar tendon  rupture, increased tics  PAIN:  Are you having pain? Yes: NPRS scale: 7-8 currently, up to 14-15, at best 5/10 Pain location: body spasms Pain description: spasms Aggravating factors: movement Relieving factors: unknown  PRECAUTIONS: Shoulder:Start physical therapy to focus on passive range of motion and active range of motion with deltoid isometrics.  No external rotation past 30 degrees. Okay to very lightly lift with the operative extremity but no lifting anything heavier than a coffee cup or cell phone.  WEIGHT BEARING RESTRICTIONS: No  FALLS:  Has patient fallen in last 6 months? No  LIVING ENVIRONMENT: Lives with: lives with their family (30 and 59 y/o daughters) Lives in: House/apartment  OCCUPATION: Has been out of work for 6-7 months, was a [employer] architect  PLOF: Independent  PATIENT GOALS:improve mobility of shoulder  NEXT MD VISIT: 09/21/22  OBJECTIVE:   COGNITION: Overall cognitive status: No family/caregiver present to determine baseline cognitive functioning     SENSATION: WFL  POSTURE: Rounded shoulders, forward head; pt with frequent involuntary tics throughout eval  UPPER EXTREMITY ROM:   Passive ROM Right eval  Left eval  Shoulder flexion    Shoulder extension    Shoulder abduction    Shoulder adduction    Shoulder internal rotation    Shoulder external rotation 30   Elbow flexion    Elbow extension    Wrist flexion    Wrist extension    Wrist ulnar deviation    Wrist radial deviation    Wrist pronation    Wrist supination    (Blank rows = not tested)  Active ROM Right eval Left eval  Shoulder flexion 130   Shoulder extension    Shoulder abduction 100   Shoulder adduction    Shoulder internal rotation    Shoulder external rotation    Elbow flexion    Elbow extension    Wrist flexion    Wrist extension    Wrist ulnar deviation    Wrist radial deviation    Wrist pronation    Wrist supination    (Blank rows = not tested)  UPPER EXTREMITY MMT:   09/17/22: Deferred  OBSERVATIONS 09/17/22: pt initially arriving with LLE foot drag c/o his his "hips twisting" and by end of session amb without foot drag; significant involuntary tics throughout session mainly from neck and shoulders but occasionally core and lower extremities BP: 147/90   TODAY'S TREATMENT:                                                                                                                                         DATE: 09/17/22  See HEP - demonstrated exercises with pt   PATIENT EDUCATION: Education details: HEP Person educated: Patient Education method: Medical illustrator Education comprehension: verbalized understanding and needs further education  HOME EXERCISE PROGRAM: Access Code: [email or web address] URL: [email or web address] Date: 09/17/2022 Prepared by: [name]  Exercises - Supine  Shoulder Flexion Extension AAROM with Dowel  - 2-3 x daily - 7 x weekly - 1-2 sets - 10 reps - Seated Shoulder Abduction AAROM with Dowel  - 2-3 x daily - 7 x weekly - 1-2 sets - 10 reps - Isometric Shoulder Flexion at Wall  - 2-3 x daily - 7 x weekly - 1-2 sets - 10 reps - 3-5 sec  hold - Isometric Shoulder Abduction at Wall  - 2-3 x daily - 7 x weekly - 1-2 sets - 10 reps - 3-5 sec hold - Isometric Shoulder Extension at Wall  - 2-3 x daily - 7 x weekly - 1-2 sets - 10 reps - 3-5 sec hold - Seated Scapular Retraction  - 2-3 x daily - 7 x weekly - 1-2 sets - 10 reps - 5 sec hold  ASSESSMENT:  CLINICAL IMPRESSION: Patient is a 56 y.o. male who was seen today for physical therapy evaluation and treatment s/p Rt reverse TSA on 08/11/22.  He demonstrates decreased strength and ROM as well as pain affecting functional mobility.  He has some involuntary tics that neurology appears to be following him for, but need to monitor.  He will benefit from PT to address deficits listed.   OBJECTIVE IMPAIRMENTS: decreased ROM, decreased strength, increased muscle spasms, impaired tone, postural dysfunction, and pain.   ACTIVITY LIMITATIONS: carrying, lifting, bathing, toileting, dressing, reach over head, hygiene/grooming, and locomotion level  PARTICIPATION LIMITATIONS: meal prep, cleaning, laundry, community activity, and occupation  PERSONAL FACTORS: 1-2 comorbidities: Lt patellar tendon rupture, increased tics  are also affecting patient's functional outcome.   REHAB POTENTIAL: Good  CLINICAL DECISION MAKING: Evolving/moderate complexity  EVALUATION COMPLEXITY: Moderate   GOALS: Goals reviewed with patient? Yes  SHORT TERM GOALS: Target date: 10/08/2022  Independent with initial HEP Goal status: INITIAL  2.  Rt shoulder AROM abduction improved to 120 deg for improved function Goal status: INITIAL   LONG TERM GOALS: Target date: 10/29/2022  Independent with final HEP Goal status: INITIAL  2.  Rt shoulder flexion and abduction improved to 135 deg for improved function Goal status: INIITAL  3.  Report pain < 3/10 with overhead reaching  for improved function Goal status: INITIAL  4.  Demonstrate 4/5 Rt shoulder flexion and abduction strength for improved  function Goal status: INITIAL   PLAN:  PT FREQUENCY: 1-2x/week  PT DURATION: 6 weeks  PLANNED INTERVENTIONS: Therapeutic exercises, Therapeutic activity, Neuromuscular re-education, Balance training, Gait training, Patient/Family education, Self Care, Joint mobilization, Aquatic Therapy, Dry Needling, Electrical stimulation, Cryotherapy, Moist heat, Taping, Vasopneumatic device, Ultrasound, Ionotophoresis 4mg /ml Dexamethasone, Manual therapy, and Re-evaluation  PLAN FOR NEXT SESSION: review HEP, progress as able, light ROM activities  Rudi Heap PT, DPT 09/21/22  4:04 PM

## 2022-09-22 ENCOUNTER — Encounter: Payer: No Typology Code available for payment source | Admitting: Physical Therapy

## 2022-09-22 ENCOUNTER — Telehealth: Payer: Self-pay | Admitting: Physical Therapy

## 2022-09-22 NOTE — Telephone Encounter (Signed)
LVM for pt about missed appt. Reminded him of next scheduled appt on 09/29/2022.

## 2022-09-29 ENCOUNTER — Telehealth: Payer: Self-pay | Admitting: Physical Therapy

## 2022-09-29 ENCOUNTER — Encounter: Payer: No Typology Code available for payment source | Admitting: Physical Therapy

## 2022-09-29 NOTE — Telephone Encounter (Signed)
LVM as pt missed PT appt.  This is 2nd no show, and MD notes at this time state to hold PT with referral to neurology.  Will keep next scheduled and cancel remaining appts, and if pt does not show again will need new referral to PT.  Clarita Crane, PT, DPT 09/29/22 11:20 AM

## 2022-10-02 ENCOUNTER — Other Ambulatory Visit: Payer: Self-pay | Admitting: Orthopedic Surgery

## 2022-10-02 ENCOUNTER — Telehealth: Payer: Self-pay | Admitting: Orthopedic Surgery

## 2022-10-02 ENCOUNTER — Telehealth: Payer: Self-pay

## 2022-10-02 MED ORDER — OXYCODONE HCL 5 MG PO TABS: ORAL_TABLET | ORAL | 0 refills | Status: DC

## 2022-10-02 MED ORDER — OXYCODONE HCL 10 MG PO TABS: 10.0000 mg | ORAL_TABLET | Freq: Three times a day (TID) | ORAL | 0 refills | Status: DC | PRN

## 2022-10-02 NOTE — Telephone Encounter (Signed)
Would you mind answering this message for a Dr. August Saucer pt? He is s/p a right reverse total shoulder 08/11/2022 asking for refill on Oxycodone 5 mg last filled 09/18/22 #30 please advise.

## 2022-10-02 NOTE — Telephone Encounter (Signed)
I called pt and lm on vm to advise that this rx has been sent to pharm.

## 2022-10-02 NOTE — Telephone Encounter (Signed)
Pharm called and advised Oxy IR 5 is on back order and asked if you could send in 10 mg instead

## 2022-10-02 NOTE — Telephone Encounter (Signed)
Patient needs oxycodone 5mg  to be called in to [facility] on [address].

## 2022-10-02 NOTE — Telephone Encounter (Signed)
Pt informed

## 2022-10-08 ENCOUNTER — Encounter: Payer: No Typology Code available for payment source | Admitting: Physical Therapy

## 2022-10-08 ENCOUNTER — Telehealth: Payer: Self-pay | Admitting: Physical Therapy

## 2022-10-08 NOTE — Telephone Encounter (Signed)
Called pt re: this morning's missed appointment - no answer, left voicemail advising will require new physician referral for future appointments per attendance policy, left office call back number.

## 2022-10-08 NOTE — Therapy (Incomplete)
OUTPATIENT PHYSICAL THERAPY TREATMENT NOTE   Patient Name: Brian Cain MRN: [id number] DOB:11/17/1965, 56 y.o., male Today's Date: 10/08/2022  PCP: Fleet Contras, MD   REFERRING PROVIDER: Julieanne Cotton, PA-C  END OF SESSION:    Past Medical History:  Diagnosis Date   Arthritis    shoulder   Past Surgical History:  Procedure Laterality Date   CLOSED REDUCTION MANDIBLE WITH MANDIBULOMA N/A 02/16/2014   Procedure: CLOSED REDUCTION MANDIBLE WITH MANDIBULOMAXILLARY FUSION;  Surgeon: Serena Colonel, MD;  Location: [facility] OR;  Service: ENT;  Laterality: N/A;   EXTERNAL FIXATOR AND ARCH BAR REMOVAL Bilateral 02/16/2014   Procedure:  ARCH BARS PLACEMENT;  Surgeon: Serena Colonel, MD;  Location: [facility] OR;  Service: ENT;  Laterality: Bilateral;   I & D EXTREMITY  08/22/2011   Procedure: IRRIGATION AND DEBRIDEMENT EXTREMITY;  Surgeon: Tami Ribas;  Location: [facility] OR;  Service: Orthopedics;  Laterality: Right;   MANDIBULAR HARDWARE REMOVAL N/A 04/09/2014   Procedure: MANDIBULAR HARDWARE REMOVAL;  Surgeon: Serena Colonel, MD;  Location: [facility];  Service: ENT;  Laterality: N/A;   PATELLAR TENDON REPAIR Left 02/06/2021   Procedure: LEFT PATELLA TENDON PRIMARY REPAIR;  Surgeon: Kathryne Hitch, MD;  Location: [facility];  Service: Orthopedics;  Laterality: Left;  block   REVERSE SHOULDER ARTHROPLASTY Right 08/11/2022   Procedure: RIGHT REVERSE SHOULDER ARTHROPLASTY;  Surgeon: Cammy Copa, MD;  Location: [facility] OR;  Service: Orthopedics;  Laterality: Right;  Regional   WRIST SURGERY     Patient Active Problem List   Diagnosis Date Noted   Arthritis of right shoulder region 08/15/2022   Biceps tendonitis on right 08/15/2022   S/P reverse total shoulder arthroplasty, right 08/11/2022   Abnormal movement 07/14/2022   Patellar tendon rupture, left, subsequent encounter 02/05/2021   Hidradenitis suppurativa 12/12/2020   Open body of mandible fracture (HCC)  02/16/2014   Bee sting 06/08/2012   Osteomyelitis of hand (HCC) 10/26/2011    REFERRING DIAG: Z96.611 (ICD-10-CM) - S/P reverse total shoulder arthroplasty, right   THERAPY DIAG:  No diagnosis found.  Rationale for Evaluation and Treatment Rehabilitation  PERTINENT HISTORY: Lt patellar tendon rupture, increased tics   PRECAUTIONS: Shoulder:Start physical therapy to focus on passive range of motion and active range of motion with deltoid isometrics.  No external rotation past 30 degrees. Okay to very lightly lift with the operative extremity but no lifting anything heavier than a coffee cup or cell phone.   WEIGHT BEARING RESTRICTIONS: No  SUBJECTIVE:  SUBJECTIVE STATEMENT:    10/08/2022 ***  Eval - Pt is a 56 y/o male who presents to OPPT s/p Rt Reverse TSA on 08/11/22. He reports his shoulder feels great, but his body has been in increased spasms which is now causing more pain and discomfort.  PAIN:  Are you having pain? Yes: NPRS scale: 7-8 currently, up to 14-15, at best 5/10 Pain location: body spasms Pain description: spasms Aggravating factors: movement Relieving factors: unknown   OBJECTIVE: (objective measures completed at initial evaluation unless otherwise dated)   COGNITION: Overall cognitive status: No family/caregiver present to determine baseline cognitive functioning                                  SENSATION: WFL   POSTURE: Rounded shoulders, forward head; pt with frequent involuntary tics throughout eval   UPPER EXTREMITY ROM:    Passive ROM Right eval Left eval  Shoulder flexion      Shoulder extension      Shoulder abduction      Shoulder adduction      Shoulder internal rotation      Shoulder external rotation 30    Elbow flexion      Elbow extension      Wrist  flexion      Wrist extension      Wrist ulnar deviation      Wrist radial deviation      Wrist pronation      Wrist supination      (Blank rows = not tested)   Active ROM Right eval Left eval  Shoulder flexion 130    Shoulder extension      Shoulder abduction 100    Shoulder adduction      Shoulder internal rotation      Shoulder external rotation      Elbow flexion      Elbow extension      Wrist flexion      Wrist extension      Wrist ulnar deviation      Wrist radial deviation      Wrist pronation      Wrist supination      (Blank rows = not tested)   UPPER EXTREMITY MMT:   09/17/22: Deferred   OBSERVATIONS 09/17/22: pt initially arriving with LLE foot drag c/o his his "hips twisting" and by end of session amb without foot drag; significant involuntary tics throughout session mainly from neck and shoulders but occasionally core and lower extremities BP: 147/90             TODAY'S TREATMENT:                                                                                                                                         DATE: 09/17/22   See HEP - demonstrated  exercises with pt     PATIENT EDUCATION: Education details: HEP Person educated: Patient Education method: Medical illustrator Education comprehension: verbalized understanding and needs further education   HOME EXERCISE PROGRAM: Access Code: [email or web address] URL: [email or web address] Date: 09/17/2022 Prepared by: [name]   Exercises - Supine Shoulder Flexion Extension AAROM with Dowel  - 2-3 x daily - 7 x weekly - 1-2 sets - 10 reps - Seated Shoulder Abduction AAROM with Dowel  - 2-3 x daily - 7 x weekly - 1-2 sets - 10 reps - Isometric Shoulder Flexion at Wall  - 2-3 x daily - 7 x weekly - 1-2 sets - 10 reps - 3-5 sec hold - Isometric Shoulder Abduction at Wall  - 2-3 x daily - 7 x weekly - 1-2 sets - 10 reps - 3-5 sec hold - Isometric Shoulder Extension at Wall  - 2-3 x  daily - 7 x weekly - 1-2 sets - 10 reps - 3-5 sec hold - Seated Scapular Retraction  - 2-3 x daily - 7 x weekly - 1-2 sets - 10 reps - 5 sec hold   ASSESSMENT:   CLINICAL IMPRESSION: 10/08/2022 Per ortho MD note 09/18/22, advised to discontinue PT until evaluated by neuro. Pt advised on this, verbalizes understanding/agreement. No visit charge. ***  Eval - Patient is a 56 y.o. male who was seen today for physical therapy evaluation and treatment s/p Rt reverse TSA on 08/11/22.  He demonstrates decreased strength and ROM as well as pain affecting functional mobility.  He has some involuntary tics that neurology appears to be following him for, but need to monitor.  He will benefit from PT to address deficits listed.    OBJECTIVE IMPAIRMENTS: decreased ROM, decreased strength, increased muscle spasms, impaired tone, postural dysfunction, and pain.    ACTIVITY LIMITATIONS: carrying, lifting, bathing, toileting, dressing, reach over head, hygiene/grooming, and locomotion level   PARTICIPATION LIMITATIONS: meal prep, cleaning, laundry, community activity, and occupation   PERSONAL FACTORS: 1-2 comorbidities: Lt patellar tendon rupture, increased tics  are also affecting patient's functional outcome.    REHAB POTENTIAL: Good   CLINICAL DECISION MAKING: Evolving/moderate complexity   EVALUATION COMPLEXITY: Moderate     GOALS: Goals reviewed with patient? Yes   SHORT TERM GOALS: Target date: 10/08/2022   Independent with initial HEP Goal status: INITIAL   2.  Rt shoulder AROM abduction improved to 120 deg for improved function Goal status: INITIAL     LONG TERM GOALS: Target date: 10/29/2022   Independent with final HEP Goal status: INITIAL   2.  Rt shoulder flexion and abduction improved to 135 deg for improved function Goal status: INIITAL   3.  Report pain < 3/10 with overhead reaching  for improved function Goal status: INITIAL   4.  Demonstrate 4/5 Rt shoulder flexion and  abduction strength for improved function Goal status: INITIAL     PLAN:   PT FREQUENCY: 1-2x/week   PT DURATION: 6 weeks   PLANNED INTERVENTIONS: Therapeutic exercises, Therapeutic activity, Neuromuscular re-education, Balance training, Gait training, Patient/Family education, Self Care, Joint mobilization, Aquatic Therapy, Dry Needling, Electrical stimulation, Cryotherapy, Moist heat, Taping, Vasopneumatic device, Ultrasound, Ionotophoresis 4mg /ml Dexamethasone, Manual therapy, and Re-evaluation   PLAN FOR NEXT SESSION: review HEP, progress as able, light ROM activities     PT, DPT 10/08/2022 10:04 AM

## 2022-10-09 ENCOUNTER — Encounter: Payer: No Typology Code available for payment source | Admitting: Physical Therapy

## 2022-10-14 ENCOUNTER — Telehealth: Payer: Self-pay | Admitting: Emergency Medicine

## 2022-10-14 ENCOUNTER — Encounter: Payer: No Typology Code available for payment source | Admitting: Physical Therapy

## 2022-10-14 NOTE — Telephone Encounter (Signed)
Pt called requesting refill. Please send pt medication to pharmacy on file. Pt states he has no meds left and in pain. Pt phone number is [phone]

## 2022-10-15 ENCOUNTER — Other Ambulatory Visit: Payer: Self-pay | Admitting: Surgical

## 2022-10-15 MED ORDER — OXYCODONE HCL 5 MG PO TABS: 5.0000 mg | ORAL_TABLET | Freq: Two times a day (BID) | ORAL | 0 refills | Status: DC | PRN

## 2022-10-15 NOTE — Telephone Encounter (Signed)
Sent in RX. This should be the last or 2nd to last RX for him postop

## 2022-10-15 NOTE — Telephone Encounter (Signed)
LMVM advising per Lurena Joiner

## 2022-10-16 ENCOUNTER — Encounter: Payer: No Typology Code available for payment source | Admitting: Physical Therapy

## 2022-10-23 ENCOUNTER — Other Ambulatory Visit (HOSPITAL_COMMUNITY): Payer: Self-pay | Admitting: Orthopedic Surgery

## 2022-10-23 ENCOUNTER — Telehealth: Payer: Self-pay | Admitting: Orthopedic Surgery

## 2022-10-23 MED ORDER — HYDROCODONE-ACETAMINOPHEN 5-325 MG PO TABS: 1.0000 | ORAL_TABLET | Freq: Four times a day (QID) | ORAL | 0 refills | Status: DC | PRN

## 2022-10-23 NOTE — Telephone Encounter (Signed)
Patient called needing Rx refilled Oxycodone 10 mg? The number to contact patient is [phone]

## 2022-10-23 NOTE — Telephone Encounter (Signed)
Norco sent in

## 2022-10-27 ENCOUNTER — Telehealth: Payer: Self-pay | Admitting: Orthopedic Surgery

## 2022-10-27 NOTE — Telephone Encounter (Signed)
Pt called requesting a refill of pain medication. Please send to pharmacy on file. Please call pt when called into pharmacy so he knows he is asking. Pt phone number is [phone].

## 2022-10-28 ENCOUNTER — Ambulatory Visit: Payer: No Typology Code available for payment source | Admitting: Orthopedic Surgery

## 2022-10-28 ENCOUNTER — Other Ambulatory Visit: Payer: Self-pay | Admitting: Surgical

## 2022-10-28 MED ORDER — HYDROCODONE-ACETAMINOPHEN 5-325 MG PO TABS: 1.0000 | ORAL_TABLET | Freq: Two times a day (BID) | ORAL | 0 refills | Status: AC | PRN

## 2022-10-28 NOTE — Telephone Encounter (Signed)
Sent in RX. Need to taper off since he is approaching 3 months out. He just asked for refill from Dr Marlou Sa 1/12.  This should be last prescription

## 2022-10-29 ENCOUNTER — Telehealth: Payer: Self-pay | Admitting: Orthopedic Surgery

## 2022-10-29 NOTE — Telephone Encounter (Signed)
Patient called asked when is his next appointment with Dr. Marlou Sa. I read message to patient concerning his pain medication. Patient said the pain medication is noting helping with the pain. The number to contact patient is [phone]

## 2022-10-29 NOTE — Telephone Encounter (Signed)
Appt scheduled

## 2022-10-29 NOTE — Telephone Encounter (Signed)
IC LMVM advising.

## 2022-11-12 ENCOUNTER — Encounter: Payer: Self-pay | Admitting: Orthopedic Surgery

## 2022-11-12 ENCOUNTER — Ambulatory Visit (INDEPENDENT_AMBULATORY_CARE_PROVIDER_SITE_OTHER): Payer: Commercial Managed Care - HMO | Admitting: Orthopedic Surgery

## 2022-11-12 DIAGNOSIS — Z96611 Presence of right artificial shoulder joint: Secondary | ICD-10-CM | POA: Diagnosis not present

## 2022-11-12 MED ORDER — TRAMADOL HCL 50 MG PO TABS: ORAL_TABLET | ORAL | 0 refills | Status: AC

## 2022-11-12 NOTE — Progress Notes (Deleted)
Office Visit Note   Patient: Brian Cain           Date of Birth: 1966/03/07           MRN: [id number] Visit Date: 11/12/2022 Requested by: Nolene Ebbs, MD [address] [place],  [place] [address] PCP: Nolene Ebbs, MD  Subjective: Chief Complaint  Patient presents with   Right Shoulder - Follow-up    HPI: Brian Cain is a 57 y.o. male who presents to the office reporting ***.                ROS: All systems reviewed are negative as they relate to the chief complaint within the history of present illness.  Patient denies fevers or chills.  Assessment & Plan: Visit Diagnoses:  1. S/P reverse total shoulder arthroplasty, right     Plan: ***  Follow-Up Instructions: No follow-ups on file.   Orders:  No orders of the defined types were placed in this encounter.  Meds ordered this encounter  Medications   traMADol (ULTRAM) 50 MG tablet    Sig: 1 po q 12hrs prn pain    Dispense:  30 tablet    Refill:  0      Procedures: No procedures performed   Clinical Data: No additional findings.  Objective: Vital Signs: There were no vitals taken for this visit.  Physical Exam:  Constitutional: Patient appears well-developed HEENT:  Head: Normocephalic Eyes:EOM are normal Neck: Normal range of motion Cardiovascular: Normal rate Pulmonary/chest: Effort normal Neurologic: Patient is alert Skin: Skin is warm Psychiatric: Patient has normal mood and affect  Ortho Exam: ***  Specialty Comments:  No specialty comments available.  Imaging: No results found.   PMFS History: Patient Active Problem List   Diagnosis Date Noted   Arthritis of right shoulder region 08/15/2022   Biceps tendonitis on right 08/15/2022   S/P reverse total shoulder arthroplasty, right 08/11/2022   Abnormal movement 07/14/2022   Patellar tendon rupture, left, subsequent encounter 02/05/2021   Hidradenitis suppurativa 12/12/2020   Open body of mandible fracture ([place])  02/16/2014   Bee sting 06/08/2012   Osteomyelitis of hand ([place]) 10/26/2011   Past Medical History:  Diagnosis Date   Arthritis    shoulder    Family History  Problem Relation Age of Onset   Cancer Mother        breast    Past Surgical History:  Procedure Laterality Date   CLOSED REDUCTION MANDIBLE WITH MANDIBULOMA N/A 02/16/2014   Procedure: CLOSED REDUCTION MANDIBLE WITH MANDIBULOMAXILLARY FUSION;  Surgeon: Izora Gala, MD;  Location: [facility];  Service: ENT;  Laterality: N/A;   EXTERNAL FIXATOR AND ARCH BAR REMOVAL Bilateral 02/16/2014   Procedure:  ARCH BARS PLACEMENT;  Surgeon: Izora Gala, MD;  Location: [facility] OR;  Service: ENT;  Laterality: Bilateral;   I & D EXTREMITY  08/22/2011   Procedure: IRRIGATION AND DEBRIDEMENT EXTREMITY;  Surgeon: Tennis Must;  Location: [facility];  Service: Orthopedics;  Laterality: Right;   MANDIBULAR HARDWARE REMOVAL N/A 04/09/2014   Procedure: MANDIBULAR HARDWARE REMOVAL;  Surgeon: Izora Gala, MD;  Location: [place];  Service: ENT;  Laterality: N/A;   PATELLAR TENDON REPAIR Left 02/06/2021   Procedure: LEFT PATELLA TENDON PRIMARY REPAIR;  Surgeon: Mcarthur Rossetti, MD;  Location: [facility];  Service: Orthopedics;  Laterality: Left;  block   REVERSE SHOULDER ARTHROPLASTY Right 08/11/2022   Procedure: RIGHT REVERSE SHOULDER ARTHROPLASTY;  Surgeon: Meredith Pel, MD;  Location: [place];  Service: Orthopedics;  Laterality: Right;  Regional   WRIST SURGERY     Social History   Occupational History   Not on file  Tobacco Use   Smoking status: Former    Packs/day: 0.30    Years: 3.00    Total pack years: 0.90    Types: Cigarettes    Quit date: 02/24/2012    Years since quitting: 10.7   Smokeless tobacco: Never  Vaping Use   Vaping Use: Never used  Substance and Sexual Activity   Alcohol use: Not Currently    Comment: occ   Drug use: Yes    Types: Marijuana    Comment: Last use was on 08/08/22   Sexual  activity: Yes    Birth control/protection: Condom

## 2022-11-15 NOTE — Progress Notes (Signed)
Post-Op Visit Note   Patient: Brian Cain           Date of Birth: 1966-02-27           MRN: [id number] Visit Date: 11/12/2022 PCP: Nolene Ebbs, MD   Assessment & Plan:  Chief Complaint:  Chief Complaint  Patient presents with   Right Shoulder - Follow-up   Visit Diagnoses:  1. S/P reverse total shoulder arthroplasty, right     Plan: [name] is a 57 year old patient with right reverse shoulder replacement 08/11/2022.  Overall he has done well with his shoulder replacement.  He states he needs something to help him with his nervous system.  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  He is having these jerking and flinching episodes which he describes as having some spasm in his right-sided abdominal muscles which causes him to jerk and flange.  He also has some that caused him to flap his arm around.  Has an appointment with neurology scheduled to 824.  On examination he has excellent strength as well as excellent passive and active range of motion.  Sometimes the ranges of motion that occur when he is jerking and flinching are nearing the limits of the replacement but he has had no instability episodes in the right shoulder.  Plan at this time is hold off on any narcotic medicine but we will try tramadol 1 every 12 hours as needed for symptoms.  He should keep his neurology appointment.  These tics and flinching's look more like Tourette's syndrome but does not really look orthopedic in nature.  He will follow-up with [place] as needed.  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  Follow-Up Instructions: No follow-ups on file.   Orders:  No orders of the defined types were placed in this encounter.  Meds ordered this encounter  Medications   traMADol (ULTRAM) 50 MG tablet    Sig: 1 po q 12hrs prn pain    Dispense:  30 tablet    Refill:  0    Imaging: No  results found.  PMFS History: Patient Active Problem List   Diagnosis Date Noted   Arthritis of right shoulder region 08/15/2022   Biceps tendonitis on right 08/15/2022   S/P reverse total shoulder arthroplasty, right 08/11/2022   Abnormal movement 07/14/2022   Patellar tendon rupture, left, subsequent encounter 02/05/2021   Hidradenitis suppurativa 12/12/2020   Open body of mandible fracture ([place]) 02/16/2014   Bee sting 06/08/2012   Osteomyelitis of hand ([place]) 10/26/2011   Past Medical History:  Diagnosis Date   Arthritis    shoulder    Family History  Problem Relation Age of Onset   Cancer Mother        breast    Past Surgical History:  Procedure Laterality Date   CLOSED REDUCTION MANDIBLE WITH MANDIBULOMA N/A 02/16/2014   Procedure: CLOSED REDUCTION MANDIBLE WITH MANDIBULOMAXILLARY FUSION;  Surgeon: Izora Gala, MD;  Location: [facility];  Service: ENT;  Laterality: N/A;   EXTERNAL FIXATOR AND ARCH BAR REMOVAL Bilateral 02/16/2014   Procedure:  ARCH BARS PLACEMENT;  Surgeon: Izora Gala, MD;  Location: [facility];  Service: ENT;  Laterality: Bilateral;   I & D EXTREMITY  08/22/2011   Procedure: IRRIGATION AND DEBRIDEMENT EXTREMITY;  Surgeon: Tennis Must;  Location: [facility];  Service: Orthopedics;  Laterality: Right;  MANDIBULAR HARDWARE REMOVAL N/A 04/09/2014   Procedure: MANDIBULAR HARDWARE REMOVAL;  Surgeon: Izora Gala, MD;  Location: [facility];  Service: ENT;  Laterality: N/A;   PATELLAR TENDON REPAIR Left 02/06/2021   Procedure: LEFT PATELLA TENDON PRIMARY REPAIR;  Surgeon: Mcarthur Rossetti, MD;  Location: [facility];  Service: Orthopedics;  Laterality: Left;  block   REVERSE SHOULDER ARTHROPLASTY Right 08/11/2022   Procedure: RIGHT REVERSE SHOULDER ARTHROPLASTY;  Surgeon: Meredith Pel, MD;  Location: [facility];  Service: Orthopedics;  Laterality: Right;  Regional   WRIST SURGERY     Social History   Occupational History   Not on file   Tobacco Use   Smoking status: Former    Packs/day: 0.30    Years: 3.00    Total pack years: 0.90    Types: Cigarettes    Quit date: 02/24/2012    Years since quitting: 10.7   Smokeless tobacco: Never  Vaping Use   Vaping Use: Never used  Substance and Sexual Activity   Alcohol use: Not Currently    Comment: occ   Drug use: Yes    Types: Marijuana    Comment: Last use was on 08/08/22   Sexual activity: Yes    Birth control/protection: Condom

## 2022-11-19 ENCOUNTER — Encounter: Payer: Self-pay | Admitting: Neurology

## 2022-11-19 ENCOUNTER — Ambulatory Visit: Payer: Commercial Managed Care - HMO | Admitting: Neurology

## 2022-11-19 VITALS — BP 172/94 | HR 73 | Ht 71.0 in | Wt 233.0 lb

## 2022-11-19 DIAGNOSIS — F952 Tourette's disorder: Secondary | ICD-10-CM | POA: Insufficient documentation

## 2022-11-19 DIAGNOSIS — F959 Tic disorder, unspecified: Secondary | ICD-10-CM | POA: Insufficient documentation

## 2022-11-19 DIAGNOSIS — R259 Unspecified abnormal involuntary movements: Secondary | ICD-10-CM

## 2022-11-19 MED ORDER — ARIPIPRAZOLE 5 MG PO TABS: 5.0000 mg | ORAL_TABLET | Freq: Every day | ORAL | 0 refills | Status: AC

## 2022-11-19 MED ORDER — ARIPIPRAZOLE 10 MG PO TABS: 10.0000 mg | ORAL_TABLET | Freq: Every day | ORAL | 11 refills | Status: DC

## 2022-11-19 NOTE — Progress Notes (Signed)
Chief Complaint  Patient presents with   New Patient (Initial Visit)    Rm 16. Patient with daughter, patient had shoulder replacement surgery and now involuntary movements are worse than before, patient reports he feels like he's dancing or twisting. Patient reports mariajuana use to help and sees improvement but wants to get it to stop.       ASSESSMENT AND PLAN  Brian Cain is a 57 y.o. 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   MEDICAL HISTORY:  Brian Cain, is a 57 year old male seen in request by Dr. Nolene Ebbs, for abnormal neck twitching, initial evaluation was on July 14, 2022, patient is alone at today's visit.  I reviewed and summarized the referring note.PMHX.  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, twisted his left leg in awkward way, February left patellar tendon rupture, required surgery,  He was able to go back to driving few months later, last driving was in May 5009, while driving to [place] through [place] area, he had a sudden body jerking towards his left side, he is weird, become very close to the rail, rarely has startled him, he has to lie on the roadside from 5 AM to 9 AM, has quit driving since then  He reported since spring 2023, he began to experience body jerking movement, usually triggered by certain movement, he would have forceful neck jerking to the left side, during today's interview, with distraction, he was  fairly normal, but often has sudden onset neck jerking movement to the left side  Also complains of severe right shoulder pain, CT of right shoulder on April 01, 2022 showed severe glenohumeral osteoarthritis with possible ankylosis of the joint space inferiorly  His abnormal jerking movement become more frequent, less predictable, affecting his daily life, sometimes he does not feel comfortable driving his personal vehicle,  UPDATE Nov 19 2022: He is accompanied by his daughter at today's clinical visit, underwent right shoulder replacement in November 2023 recovered well, still have right shoulder pain  Continue complains of body jerking movement, has been going on for more than 8 years, change the type of body jerking movement, sometimes he has quick neck jerking movement typical for motor tic, daughter also reported vocal tics, clear his throat, but the most bothersome symptoms is more complex hip shaking movement, he felt like he has to straighten his spine,  He does complains of excessive stress, tearful at today's visit, family stress, depression, smoke marijuana regularly, reported previously benefit psychotherapy, but lost follow-up, not sleeping well,  Personally reviewed MRI cervical spine October 2023, mild degenerative changes, C4-5 left severe facet hypertrophy, mild spinal stenosis moderate left foraminal stenosis,  MRI of brain without contrast, mild small vessel disease no acute abnormality  Laboratory evaluations in October 2023: Mild elevation of copper 137, ceruloplasmin 32.2, normal TSH, CPK, CBC, hemoglobin of 14.2, creatinine of 1.04,    PHYSICAL EXAM:   Vitals:   11/19/22 1114  BP: Marland Kitchen)  172/94  Pulse: 73  Weight: 233 lb (105.7 kg)  Height: 5\' 11"  (1.803 m)     Body mass index is 32.5 kg/m.  PHYSICAL EXAMNIATION:  Gen: NAD, conversant, well nourised, well groomed                     Cardiovascular: Regular rate rhythm, no peripheral edema, warm,  nontender. Eyes: Conjunctivae clear without exudates or hemorrhage Neck: Supple, no carotid bruits. Pulmonary: Clear to auscultation bilaterally   NEUROLOGICAL EXAM:  MENTAL STATUS: Speech/cognition: Awake, alert, oriented to history taking and casual conversation CRANIAL NERVES: CN II: Visual fields are full to confrontation. Pupils are round equal and briskly reactive to light. CN III, IV, VI: extraocular movement are normal. No ptosis. CN V: Facial sensation is intact to light touch CN VII: Face is symmetric with normal eye closure  CN VIII: Hearing is normal to causal conversation. CN IX, X: Phonation is normal. CN XI: Head turning and shoulder shrug are intact  MOTOR: Mild limited range of motion of shoulder, no significant weakness  REFLEXES: Reflexes are 1 and symmetric at the biceps, triceps, knees, and ankles. Plantar responses are flexor.  SENSORY: Intact to light touch, pinprick and vibratory sensation are intact in fingers and toes.  COORDINATION: There is no trunk or limb dysmetria noted.  GAIT/STANCE: Posture is normal. Gait is steady   REVIEW OF SYSTEMS:  Full 14 system review of systems performed and notable only for as above All other review of systems were negative.   ALLERGIES: Allergies  Allergen Reactions   Bee Venom Swelling    HOME MEDICATIONS: Current Outpatient Medications  Medication Sig Dispense Refill   acetaminophen (TYLENOL) 325 MG tablet Take 2 tablets (650 mg total) by mouth every 6 (six) hours as needed for mild pain (pain score 1-3 or temp > 100.5). 30 tablet 0   aspirin EC 81 MG tablet Take 1 tablet (81 mg total) by mouth daily. Swallow whole. 30 tablet 12   celecoxib (CELEBREX) 100 MG capsule Take 1 capsule (100 mg total) by mouth 2 (two) times daily. 60 capsule 0   gabapentin (NEURONTIN) 300 MG capsule Take 1 capsule (300 mg total) by mouth 3 (three) times daily. 30 capsule 0   HYDROcodone-acetaminophen (NORCO) 5-325 MG tablet  Take 1 tablet by mouth every 12 (twelve) hours as needed for moderate pain. 20 tablet 0   Menthol, Topical Analgesic, (ICY HOT EX) Apply 1 Application topically daily as needed (pain).     methocarbamol (ROBAXIN) 500 MG tablet Take 1 tablet (500 mg total) by mouth every 8 (eight) hours as needed for muscle spasms. 30 tablet 0   Multiple Vitamin (MULTIVITAMIN) tablet Take 1 tablet by mouth daily.     traMADol (ULTRAM) 50 MG tablet 1 po q 12hrs prn pain 30 tablet 0   No current facility-administered medications for this visit.    PAST MEDICAL HISTORY: Past Medical History:  Diagnosis Date   Arthritis    shoulder    PAST SURGICAL HISTORY: Past Surgical History:  Procedure Laterality Date   CLOSED REDUCTION MANDIBLE WITH MANDIBULOMA N/A 02/16/2014   Procedure: CLOSED REDUCTION MANDIBLE WITH MANDIBULOMAXILLARY FUSION;  Surgeon: Izora Gala, MD;  Location: [facility];  Service: ENT;  Laterality: N/A;   EXTERNAL FIXATOR AND ARCH BAR REMOVAL Bilateral 02/16/2014   Procedure:  ARCH BARS PLACEMENT;  Surgeon: Izora Gala, MD;  Location: [facility];  Service: ENT;  Laterality: Bilateral;   I & D EXTREMITY  08/22/2011  Procedure: IRRIGATION AND DEBRIDEMENT EXTREMITY;  Surgeon: Tennis Must;  Location: [facility];  Service: Orthopedics;  Laterality: Right;   MANDIBULAR HARDWARE REMOVAL N/A 04/09/2014   Procedure: MANDIBULAR HARDWARE REMOVAL;  Surgeon: Izora Gala, MD;  Location: [facility];  Service: ENT;  Laterality: N/A;   PATELLAR TENDON REPAIR Left 02/06/2021   Procedure: LEFT PATELLA TENDON PRIMARY REPAIR;  Surgeon: Mcarthur Rossetti, MD;  Location: [facility];  Service: Orthopedics;  Laterality: Left;  block   REVERSE SHOULDER ARTHROPLASTY Right 08/11/2022   Procedure: RIGHT REVERSE SHOULDER ARTHROPLASTY;  Surgeon: Meredith Pel, MD;  Location: [facility];  Service: Orthopedics;  Laterality: Right;  Regional   WRIST SURGERY      FAMILY HISTORY: Family History  Problem  Relation Age of Onset   Cancer Mother        breast    SOCIAL HISTORY: Social History   Socioeconomic History   Marital status: Single    Spouse name: Not on file   Number of children: Not on file   Years of education: Not on file   Highest education level: Not on file  Occupational History   Not on file  Tobacco Use   Smoking status: Former    Packs/day: 0.30    Years: 3.00    Total pack years: 0.90    Types: Cigarettes    Quit date: 02/24/2012    Years since quitting: 10.7   Smokeless tobacco: Never  Vaping Use   Vaping Use: Never used  Substance and Sexual Activity   Alcohol use: Not Currently    Comment: occ   Drug use: Yes    Types: Marijuana    Comment: Last use was on 08/08/22   Sexual activity: Yes    Birth control/protection: Condom  Other Topics Concern   Not on file  Social History Narrative   ** Merged History Encounter **       Social Determinants of Health   Financial Resource Strain: Not on file  Food Insecurity: No Food Insecurity (08/11/2022)   Hunger Vital Sign    Worried About Running Out of Food in the Last Year: Never true    [place] in the Last Year: Never true  Transportation Needs: No Transportation Needs (08/11/2022)   PRAPARE - Hydrologist (Medical): No    Lack of Transportation (Non-Medical): No  Physical Activity: Not on file  Stress: Not on file  Social Connections: Not on file  Intimate Partner Violence: Not At Risk (08/11/2022)   Humiliation, Afraid, Rape, and Kick questionnaire    Fear of Current or Ex-Partner: No    Emotionally Abused: No    Physically Abused: No    Sexually Abused: No      Marcial Pacas, M.D. Ph.D.  [facility] [address] [place], [place] [address] Ph: [phone] Fax: [phone]  CC:  Meredith Pel, MD [place],  [place] [phone]  Nolene Ebbs, MD

## 2022-11-23 ENCOUNTER — Telehealth: Payer: Self-pay | Admitting: Neurology

## 2022-11-23 NOTE — Telephone Encounter (Signed)
Pt came into the office, asking for a note from Dr. Krista Blue stating his condition has been caused by stress and other things they spoke about in the appt. Pt would like this ASAP for his lawyer.

## 2022-11-23 NOTE — Telephone Encounter (Signed)
All information in his medical chart, he should have access to it via [facility], I would not generate separate note

## 2022-11-24 NOTE — Telephone Encounter (Signed)
Please update pt on Dr. Rhea Belton message when he calls back.

## 2022-12-11 ENCOUNTER — Telehealth: Payer: Self-pay | Admitting: Neurology

## 2022-12-11 ENCOUNTER — Telehealth: Payer: Self-pay | Admitting: Orthopedic Surgery

## 2022-12-11 NOTE — Telephone Encounter (Signed)
Called and [facility] for patient.

## 2022-12-11 NOTE — Telephone Encounter (Signed)
Too far out from surgery for opioid medication

## 2022-12-11 NOTE — Telephone Encounter (Signed)
Patient requesting a refill on his Oxycodone

## 2022-12-11 NOTE — Telephone Encounter (Signed)
Pt is calling requesting a refill on ARIPiprazole (ABILIFY) 10 MG tablet. Should be [facility] to [id number]

## 2022-12-14 ENCOUNTER — Telehealth: Payer: Self-pay | Admitting: Surgical

## 2022-12-14 MED ORDER — ARIPIPRAZOLE 10 MG PO TABS: 10.0000 mg | ORAL_TABLET | Freq: Every day | ORAL | 2 refills | Status: DC

## 2022-12-14 NOTE — Telephone Encounter (Signed)
[facility] sent to [facility] per patient request.

## 2022-12-14 NOTE — Telephone Encounter (Signed)
Spoke with patient he and read note from [place] to him.  Patient said he is still having the shakes like something is wrong with his bones. I advised him that he can take per Lovena Le over the counter pain medication. Patient said he would still like to talk with someone and come back in.  The number to contact patient is [phone]

## 2022-12-17 ENCOUNTER — Encounter: Payer: Self-pay | Admitting: Radiology

## 2023-01-21 ENCOUNTER — Telehealth: Payer: Self-pay | Admitting: Neurology

## 2023-01-25 ENCOUNTER — Ambulatory Visit: Payer: Commercial Managed Care - HMO | Admitting: Neurology

## 2023-01-25 ENCOUNTER — Encounter: Payer: Self-pay | Admitting: Neurology

## 2023-01-27 ENCOUNTER — Other Ambulatory Visit: Payer: Self-pay

## 2023-01-27 MED ORDER — ARIPIPRAZOLE 10 MG PO TABS: 10.0000 mg | ORAL_TABLET | Freq: Every day | ORAL | 2 refills | Status: AC

## 2023-01-27 NOTE — Progress Notes (Signed)
Rx refilled to [facility] as per patient request.

## 2023-01-29 ENCOUNTER — Other Ambulatory Visit: Payer: Self-pay | Admitting: Surgical

## 2023-05-20 ENCOUNTER — Ambulatory Visit: Payer: Commercial Managed Care - HMO | Admitting: Neurology

## 2023-05-31 ENCOUNTER — Encounter: Payer: Self-pay | Admitting: Neurology

## 2023-05-31 ENCOUNTER — Ambulatory Visit (INDEPENDENT_AMBULATORY_CARE_PROVIDER_SITE_OTHER): Payer: Self-pay | Admitting: Neurology

## 2023-05-31 VITALS — BP 155/88 | HR 65 | Ht 66.0 in | Wt 218.0 lb

## 2023-05-31 DIAGNOSIS — R52 Pain, unspecified: Secondary | ICD-10-CM | POA: Insufficient documentation

## 2023-05-31 DIAGNOSIS — R259 Unspecified abnormal involuntary movements: Secondary | ICD-10-CM

## 2023-05-31 DIAGNOSIS — F39 Unspecified mood [affective] disorder: Secondary | ICD-10-CM | POA: Insufficient documentation

## 2023-05-31 NOTE — Progress Notes (Signed)
Chief Complaint  Patient presents with   Follow-up    Rm12: daughter, wife present Tourette syndrome 6 month followup:pt stated that he stopped all medication due to after 3-4 days due to it making him feel funny. I spoke to him and stated its important to take medication and we cannot get him on a steady regimen unless he is willing to try. He voiced gratitude and understanding.       ASSESSMENT AND PLAN  Brian Cain is a 57 y.o. male    Body jerking movement  Variable effort on today's examination,  Was not compliant with his Abilify, wife revealed that he used marijuana and cocaine frequently,  Personally reviewed MRI of the brain in October 2023, mild small vessel disease no acute abnormality chronic sinusitis  MRI of cervical spine, mild degenerative changes there was no significant canal stenosis or foraminal narrowing  Laboratory evaluation showed normal CPK, TSH,  No neurological explanation for his constellation of complaints, but advised him continue follow-up with his primary care physician and psychiatrist if needed  DIAGNOSTIC DATA (LABS, IMAGING, TESTING) - I reviewed patient records, labs, notes, testing and imaging myself where available.   MEDICAL HISTORY:  Brian Cain, is a 57 year old male seen in request by Dr. Fleet Contras, for abnormal neck twitching, initial evaluation was on July 14, 2022, patient is alone at today's visit.  I reviewed and summarized the referring note.PMHX.  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, he was trying to avoid boxes falling out of the truck, twisted his left leg in awkward way, February left patellar tendon rupture, required surgery,  He was able to go back to driving few months later, last driving was in May 2023, while driving to [place] through [place] area, he had a sudden body jerking towards his left side, he is weird, become very close to the rail, rarely has startled him, he has to  lie on the roadside from 5 AM to 9 AM, has quit driving since then  He reported since spring 2023, he began to experience body jerking movement, usually triggered by certain movement, he would have forceful neck jerking to the left side, during today's interview, with distraction, he was fairly normal, but often has sudden onset neck jerking movement to the left side  Also complains of severe right shoulder pain, CT of right shoulder on April 01, 2022 showed severe glenohumeral osteoarthritis with possible ankylosis of the joint space inferiorly  His abnormal jerking movement become more frequent, less predictable, affecting his daily life, sometimes he does not feel comfortable driving his personal vehicle,  UPDATE Nov 19 2022: He is accompanied by his daughter at today's clinical visit, underwent right shoulder replacement in November 2023 recovered well, still have right shoulder pain  Continue complains of body jerking movement, has been going on for more than 8 years, change the type of body jerking movement, sometimes he has quick neck jerking movement typical for motor tic, daughter also reported vocal tics, clear his throat, but the most bothersome symptoms is more complex hip shaking movement, he felt like he has to straighten his spine,  He does complains of excessive stress, tearful at today's visit, family stress, depression, smoke marijuana regularly, reported previously benefit psychotherapy, but lost follow-up, not sleeping well,  Personally reviewed MRI cervical spine October 2023, mild degenerative changes, C4-5 left severe facet hypertrophy, mild spinal stenosis moderate left foraminal stenosis,  MRI of brain without contrast, mild small vessel  disease no acute abnormality  Laboratory evaluations in October 2023: Mild elevation of copper 137, ceruloplasmin 32.2, normal TSH, CPK, CBC, hemoglobin of 14.2, creatinine of 1.04,  UPDATE August 19th 2024: He is accompanied by his wife  and daughter at today's clinical visit, he only tried Abilify for short period of time, no longer taking it, complains of depression, not doing well, variable effort on today's examination  Apparently daughter and wife are very disappointed, " He is not doing well", uses marijuana and cocaine frequently  Personally reviewed MRI of the brain, mild small vessel disease  MRI of cervical spine multilevel degenerative changes, no significant canal foraminal narrowing  Normal CPK TSH  PHYSICAL EXAM:   Vitals:   05/31/23 1134  BP: (!) 155/88  Pulse: 65  Weight: 218 lb (98.9 kg)  Height: 5\' 6"  (1.676 m)     Body mass index is 35.19 kg/m.  PHYSICAL EXAMNIATION:  Gen: NAD, conversant, well nourised, well groomed                     Cardiovascular: Regular rate rhythm, no peripheral edema, warm, nontender. Eyes: Conjunctivae clear without exudates or hemorrhage Neck: Supple, no carotid bruits. Pulmonary: Clear to auscultation bilaterally   NEUROLOGICAL EXAM:  MENTAL STATUS: Speech/cognition: Awake, alert, oriented to history taking and casual conversation CRANIAL NERVES: CN II: Visual fields are full to confrontation. Pupils are round equal and briskly reactive to light. CN III, IV, VI: extraocular movement are normal. No ptosis. CN V: Facial sensation is intact to light touch CN VII: Face is symmetric with normal eye closure  CN VIII: Hearing is normal to causal conversation. CN IX, X: Phonation is normal. CN XI: Head turning and shoulder shrug are intact  MOTOR: Mild limited range of motion of shoulder, no significant weakness, variable effort on examinations,  REFLEXES: Reflexes are 1 and symmetric at the biceps, triceps, knees, and ankles. Plantar responses are flexor.  SENSORY: Intact to light touch, pinprick and vibratory sensation are intact in fingers and toes.  COORDINATION: There is no trunk or limb dysmetria noted.  GAIT/STANCE: His gait can be normal, but  showed variable effort, sometimes whole body large amplitude tremor but was able to keep excellent balance  REVIEW OF SYSTEMS:  Full 14 system review of systems performed and notable only for as above All other review of systems were negative.   ALLERGIES: Allergies  Allergen Reactions   Bee Venom Swelling    HOME MEDICATIONS: Current Outpatient Medications  Medication Sig Dispense Refill   acetaminophen (TYLENOL) 325 MG tablet Take 2 tablets (650 mg total) by mouth every 6 (six) hours as needed for mild pain (pain score 1-3 or temp > 100.5). (Patient not taking: Reported on 05/31/2023) 30 tablet 0   ARIPiprazole (ABILIFY) 10 MG tablet Take 1 tablet (10 mg total) by mouth daily. (Patient not taking: Reported on 05/31/2023) 30 tablet 2   ARIPiprazole (ABILIFY) 5 MG tablet Take 1 tablet (5 mg total) by mouth daily. (Patient not taking: Reported on 05/31/2023) 30 tablet 0   aspirin EC 81 MG tablet Take 1 tablet (81 mg total) by mouth daily. Swallow whole. (Patient not taking: Reported on 05/31/2023) 30 tablet 12   celecoxib (CELEBREX) 100 MG capsule Take 1 capsule (100 mg total) by mouth 2 (two) times daily. (Patient not taking: Reported on 05/31/2023) 60 capsule 0   gabapentin (NEURONTIN) 300 MG capsule Take 1 capsule (300 mg total) by mouth 3 (three) times daily. (Patient  not taking: Reported on 05/31/2023) 30 capsule 0   HYDROcodone-acetaminophen (NORCO) 5-325 MG tablet Take 1 tablet by mouth every 12 (twelve) hours as needed for moderate pain. (Patient not taking: Reported on 05/31/2023) 20 tablet 0   Menthol, Topical Analgesic, (ICY HOT EX) Apply 1 Application topically daily as needed (pain). (Patient not taking: Reported on 05/31/2023)     methocarbamol (ROBAXIN) 500 MG tablet Take 1 tablet (500 mg total) by mouth every 8 (eight) hours as needed for muscle spasms. (Patient not taking: Reported on 05/31/2023) 30 tablet 0   Multiple Vitamin (MULTIVITAMIN) tablet Take 1 tablet by mouth daily.  (Patient not taking: Reported on 05/31/2023)     traMADol (ULTRAM) 50 MG tablet 1 po q 12hrs prn pain (Patient not taking: Reported on 05/31/2023) 30 tablet 0   No current facility-administered medications for this visit.    PAST MEDICAL HISTORY: Past Medical History:  Diagnosis Date   Arthritis    shoulder    PAST SURGICAL HISTORY: Past Surgical History:  Procedure Laterality Date   CLOSED REDUCTION MANDIBLE WITH MANDIBULOMA N/A 02/16/2014   Procedure: CLOSED REDUCTION MANDIBLE WITH MANDIBULOMAXILLARY FUSION;  Surgeon: Serena Colonel, MD;  Location[facility];  Service: ENT;  Laterality: N/A;   EXTERNAL FIXATOR AND ARCH BAR REMOVAL Bilateral 02/16/2014   Procedure:  ARCH BARS PLACEMENT;  Surgeon: Serena Colonel, MD;  Location: [facility] OR;  Service: ENT;  Laterality: Bilateral;   I & D EXTREMITY  08/22/2011   Procedure: IRRIGATION AND DEBRIDEMENT EXTREMITY;  Surgeon: Tami Ribas;  Location: [facility];  Service: Orthopedics;  Laterality: Right;   MANDIBULAR HARDWARE REMOVAL N/A 04/09/2014   Procedure: MANDIBULAR HARDWARE REMOVAL;  Surgeon: Serena Colonel, MD;  Location: [facility];  Service: ENT;  Laterality: N/A;   PATELLAR TENDON REPAIR Left 02/06/2021   Procedure: LEFT PATELLA TENDON PRIMARY REPAIR;  Surgeon: Kathryne Hitch, MD;  Location: [facility];  Service: Orthopedics;  Laterality: Left;  block   REVERSE SHOULDER ARTHROPLASTY Right 08/11/2022   Procedure: RIGHT REVERSE SHOULDER ARTHROPLASTY;  Surgeon: Cammy Copa, MD;  [facility];  Service: Orthopedics;  Laterality: Right;  Regional   WRIST SURGERY      FAMILY HISTORY: Family History  Problem Relation Age of Onset   Cancer Mother        breast    SOCIAL HISTORY: Social History   Socioeconomic History   Marital status: Single    Spouse name: Not on file   Number of children: Not on file   Years of education: Not on file   Highest education level: Not on file  Occupational History   Not on  file  Tobacco Use   Smoking status: Former    Current packs/day: 0.00    Average packs/day: 0.3 packs/day for 3.0 years (0.9 ttl pk-yrs)    Types: Cigarettes    Start date: 02/23/2009    Quit date: 02/24/2012    Years since quitting: 11.2   Smokeless tobacco: Never  Vaping Use   Vaping status: Never Used  Substance and Sexual Activity   Alcohol use: Not Currently    Comment: occ   Drug use: Yes    Types: Marijuana    Comment: Last use was on 08/08/22   Sexual activity: Yes    Birth control/protection: Condom  Other Topics Concern   Not on file  Social History Narrative   ** Merged History Encounter **       Social Determinants of Health  Financial Resource Strain: Not on file  Food Insecurity: No Food Insecurity (08/11/2022)   Hunger Vital Sign    Worried About Running Out of Food in the Last Year: Never true    Ran Out of Food in the Last Year: Never true  Transportation Needs: No Transportation Needs (08/11/2022)   PRAPARE - Administrator, Civil Service (Medical): No    Lack of Transportation (Non-Medical): No  Physical Activity: Not on file  Stress: Not on file  Social Connections: Not on file  Intimate Partner Violence: Not At Risk (08/11/2022)   Humiliation, Afraid, Rape, and Kick questionnaire    Fear of Current or Ex-Partner: No    Emotionally Abused: No    Physically Abused: No    Sexually Abused: No      Levert Feinstein, M.D. Ph.D.  [facility] [address] [place], [place] [address] Ph: [phone] Fax: [phone]  CC:  Fleet Contras, MD [address] [place],  [place] [address]  Fleet Contras, MD

## 2023-09-21 DIAGNOSIS — Z0271 Encounter for disability determination: Secondary | ICD-10-CM
